# Patient Record
Sex: Male | Born: 1940 | Race: White | Marital: Married | Smoking: Former smoker
Health system: Southern US, Community
[De-identification: ages and names within clinical notes are randomized; demographics above are authoritative.]

---

## 2018-07-21 ENCOUNTER — Institutional Professional Consult (permissible substitution): Payer: Self-pay | Admitting: Pulmonary Disease

## 2018-08-10 ENCOUNTER — Institutional Professional Consult (permissible substitution): Payer: Self-pay | Admitting: Pulmonary Disease

## 2018-08-10 NOTE — Progress Notes (Deleted)
   Synopsis: Referred in *** for ***  Subjective:   PATIENT ID: Seth Sims GENDER: male DOB: 07-Nov-1941, MRN: [id number]   HPI  No chief complaint on file.   ***  No past medical history on file.   No family history on file.   Social History   Socioeconomic History  . Marital status: Not on file    Spouse name: Not on file  . Number of children: Not on file  . Years of education: Not on file  . Highest education level: Not on file  Occupational History  . Not on file  Social Needs  . Financial resource strain: Not on file  . Food insecurity:    Worry: Not on file    Inability: Not on file  . Transportation needs:    Medical: Not on file    Non-medical: Not on file  Tobacco Use  . Smoking status: Not on file  Substance and Sexual Activity  . Alcohol use: Not on file  . Drug use: Not on file  . Sexual activity: Not on file  Lifestyle  . Physical activity:    Days per week: Not on file    Minutes per session: Not on file  . Stress: Not on file  Relationships  . Social connections:    Talks on phone: Not on file    Gets together: Not on file    Attends religious service: Not on file    Active member of club or organization: Not on file    Attends meetings of clubs or organizations: Not on file    Relationship status: Not on file  . Intimate partner violence:    Fear of current or ex partner: Not on file    Emotionally abused: Not on file    Physically abused: Not on file    Forced sexual activity: Not on file  Other Topics Concern  . Not on file  Social History Narrative  . Not on file     Allergies not on file   No outpatient medications prior to visit.   No facility-administered medications prior to visit.     ROS    Objective:  Physical Exam   There were no vitals filed for this visit.  ***  CBC No results found for: WBC, RBC, HGB, HCT, PLT, MCV, MCH, MCHC, RDW, LYMPHSABS, MONOABS, EOSABS, BASOSABS   Chest  imaging:  PFT:  Labs:  Path:  Echo:  Heart Catheterization:       Assessment & Plan:   No diagnosis found.  Discussion: ***   No current outpatient medications on file.

## 2018-09-07 ENCOUNTER — Institutional Professional Consult (permissible substitution): Payer: Self-pay | Admitting: Pulmonary Disease

## 2018-09-12 ENCOUNTER — Ambulatory Visit (INDEPENDENT_AMBULATORY_CARE_PROVIDER_SITE_OTHER): Payer: No Typology Code available for payment source | Admitting: Pulmonary Disease

## 2018-09-12 ENCOUNTER — Encounter: Payer: Self-pay | Admitting: Pulmonary Disease

## 2018-09-12 VITALS — BP 118/72 | HR 76 | Ht 71.0 in | Wt 229.0 lb

## 2018-09-12 DIAGNOSIS — J849 Interstitial pulmonary disease, unspecified: Secondary | ICD-10-CM

## 2018-09-12 DIAGNOSIS — R0602 Shortness of breath: Secondary | ICD-10-CM

## 2018-09-12 NOTE — Patient Instructions (Signed)
Worsening shortness of breath: (New problem) We will check a full lung function test We will check a high-resolution CT scan of your chest We will check your oxygen while walking today in clinic  Question of interstitial lung disease seen on recent CT scan of the chest: We will arrange for a high-resolution CT scan of the chest at our center We will check a full lung function test  We will see you back in 1 to 2 weeks to go over these results

## 2018-09-12 NOTE — Progress Notes (Signed)
Synopsis: [facility]; has a history of coronary artery disease, polymyalgia rheumatica treated briefly with methotrexate.  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  Has had a history of skin mycobacterial disease.  Subjective:   PATIENT ID: Seth Sims GENDER: male DOB: 16-Jun-1941, MRN: [id number]   HPI  Chief Complaint  Patient presents with  . Pulm Consult    [facility]E. Patient states his DOE has been getting worse over the past 2 years. Has had numerous CT scans and all have been negative.     This is a pleasant 77 year old male with a past medical history significant for polymyalgia rheumatica, coronary artery disease, and atrial fibrillation who comes to my clinic today for evaluation of shortness of breath.  He says that he does not have cough or mucus production.  He has been experiencing increasing shortness of breath for the last 2 years now.  He says that he can only walk about 100 feet before having to stop and catch his breath.  He says the problem has been worsening over 2 years.  He has been evaluated by multiple different physicians for this and has been told most recently that his heart was doing okay.  That being said, he is in permanent atrial fibrillation.  He had 2 stents placed this year as well.  This year he has developed a mycobacterial infection of the skin which has been [facility].  He also has acquired immunodeficiency and has been receiving IVIG every 4 weeks.  He has been treated with clofazimine among other antibiotics for the mycobacterial infection.   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 No pertinent past medical history.   History reviewed. No pertinent family history.   Social History   Socioeconomic History  . Marital status: Married    Spouse name: Not on file  . Number of children: Not on file  . Years of education: Not on file  . Highest education level: Not on file  Occupational History  . Not on file  Social Needs  . Financial resource strain: Not on file  . Food insecurity:    Worry: Not on file    Inability: Not on file  . Transportation needs:    Medical: Not on file    Non-medical: Not on file  Tobacco Use  . Smoking status: Former Smoker    Packs/day: 1.50    Years: 60.00    Pack years: 90.00    Last attempt to quit: 01/13/1964    Years since quitting: 54.7  . Smokeless tobacco: Former Neurosurgeon    Types: Chew  Substance and Sexual Activity  . Alcohol use: Not Currently  . Drug use: Never  . Sexual activity: Not on file  Lifestyle  . Physical activity:    Days per week: Not on file    Minutes per session: Not on file  . Stress: Not on file  Relationships  .  Social connections:    Talks on phone: Not on file    Gets together: Not on file    Attends religious service: Not on file    Active member of club or organization: Not on file    Attends meetings of clubs or organizations: Not on file    Relationship status: Not on file  . Intimate partner violence:    Fear of current or ex partner: Not on file    Emotionally abused: Not on file    Physically abused: Not on file    Forced sexual activity: Not on file  Other Topics Concern  . Not on file  Social History Narrative  . Not on file     Allergies  Allergen Reactions  . Penicillins Anaphylaxis, Rash and Swelling    swelling swelling   . Hydrocodone-Acetaminophen Rash  . Omeprazole Other (See  Comments) and Rash    Unknown Unknown   . Sulfa Antibiotics Rash    Swelling Swelling Swelling   . Statins Other (See Comments)    MUSCLE WEAKNESS MUSCLE WEAKNESS   . Sulfamethoxazole-Trimethoprim      Outpatient Medications Prior to Visit  Medication Sig Dispense Refill  . aspirin 81 MG tablet Take by mouth.    . Calcium Carb-Cholecalciferol (OYSTER SHELL CALCIUM/VITAMIN D) 500-200 MG-UNIT TABS Take by mouth.    . Carboxymethylcellulose Sod PF (REFRESH PLUS) 0.5 % SOLN Place 1 drop into both eyes 4 times daily.    . Cholecalciferol (VITAMIN D3) 1000 units CAPS Take by mouth.    . clopidogrel (PLAVIX) 75 MG tablet Take by mouth.    . diltiazem (CARDIZEM CD) 180 MG 24 hr capsule Take by mouth.    . DULoxetine (CYMBALTA) 30 MG capsule Take by mouth.    . finasteride (PROSCAR) 5 MG tablet Take by mouth.    . folic acid (FOLVITE) 1 MG tablet Take by mouth.    . latanoprost (XALATAN) 0.005 % ophthalmic solution Place 1 drop into both eyes nightly.    . loratadine (CLARITIN) 10 MG tablet Take by mouth.    Marland Kitchen. LORazepam (ATIVAN) 0.5 MG tablet Take by mouth.    . magnesium oxide (MAG-OX) 400 MG tablet Take by mouth.    . mirabegron ER (MYRBETRIQ) 25 MG TB24 tablet Take by mouth.    . oxyCODONE-acetaminophen (PERCOCET) 10-325 MG tablet Take by mouth.    . oxyCODONE-acetaminophen (PERCOCET/ROXICET) 5-325 MG tablet Take by mouth.    . pantoprazole (PROTONIX) 40 MG tablet Take by mouth.    . potassium chloride SA (K-DUR,KLOR-CON) 20 MEQ tablet Take by mouth.    . pyridOXINE (B-6) 50 MG tablet Take by mouth.    . rosuvastatin (CRESTOR) 10 MG tablet Take by mouth.    . spironolactone (ALDACTONE) 25 MG tablet Take by mouth.    . tamsulosin (FLOMAX) 0.4 MG CAPS capsule Take by mouth.    . torsemide (DEMADEX) 20 MG tablet Take by mouth.    . vitamin A 4132410000 UNIT capsule Take by mouth.    . vitamin B-12 (CYANOCOBALAMIN) 1000 MCG tablet Take by mouth.    . vitamin C (ASCORBIC ACID) 500 MG  tablet Take by mouth.    . vitamin E 400 UNIT capsule Take by mouth.     No facility-administered medications prior to visit.     Review of Systems  Constitutional: Positive for malaise/fatigue. Negative for chills, fever and weight loss.  HENT: Negative for congestion, nosebleeds, sinus pain and sore throat.  Eyes: Negative for photophobia, pain and discharge.  Respiratory: Positive for shortness of breath. Negative for cough, hemoptysis, sputum production and wheezing.   Cardiovascular: Negative for chest pain, palpitations, orthopnea and leg swelling.  Gastrointestinal: Negative for abdominal pain, constipation, diarrhea, nausea and vomiting.  Genitourinary: Negative for dysuria, frequency, hematuria and urgency.  Musculoskeletal: Negative for back pain, joint pain, myalgias and neck pain.  Skin: Negative for itching and rash.  Neurological: Negative for tingling, tremors, sensory change, speech change, focal weakness, seizures, weakness and headaches.  Psychiatric/Behavioral: Negative for memory loss, substance abuse and suicidal ideas. The patient is not nervous/anxious.       Objective:  Physical Exam   Vitals:   09/12/18 1352  BP: 118/72  Pulse: 76  SpO2: 94%  Weight: 229 lb (103.9 kg)  Height: 5\' 11"  (1.803 m)   Walked 400 feet and O2 saturation went to 100% on RA  RA  Gen: well appearing, no acute distress HENT: NCAT, OP clear, neck supple without masses Eyes: PERRL, EOMi Lymph: no cervical lymphadenopathy PULM: Coarse crackles bases B, normal effort CV: Irreg irreg, no mgr, no JVD GI: BS+, soft, nontender, no hsm Derm: no rash or skin breakdown MSK: normal bulk and tone Neuro: A&Ox4, CN II-XII intact, strength 5/5 in all 4 extremities Psyche: normal mood and affect   CBC No results found for: WBC, RBC, HGB, HCT, PLT, MCV, MCH, MCHC, RDW, LYMPHSABS, MONOABS, EOSABS, BASOSABS   Chest imaging: May 2019 CT scan from the [place] commented on peripheral  reticulation in the right middle lobe and right lower lobe as well as in the left lower lobe, there is also "stable" nodules compared to a 2017 study.  A thoracic aortic aneurysm at 4.1 cm was noted.  PFT:  Labs: July 2019 labs from the [place] reviewed showing a creatinine of 1.8 and a hemoglobin of 13 mg/dL  Path:  Echo: 1610 echocardiogram from the [place] showed a normal LVEF, RVSP 33 mmHg  Heart Catheterization:  Records from the [place] reviewed where the patient was seen for dyspnea, it was felt to be multifactorial and a CPAP was recommended.  The patient declined.  Notes indicate that the patient has received the Pneumovax vaccine, Prevnar vaccine, and annual flu shots      Assessment & Plan:   Shortness of breath - Plan: CT Chest High Resolution, Pulmonary function test  ILD (interstitial lung disease) (HCC)  Discussion: Mr. Delillo is a complex medical history who comes to my clinic today for evaluation of shortness of breath.  Objectively he has crackles in the bases of his lungs and radiology recently commented on reticulation.  From what I can tell he had normal lung function testing at the [place] this year but I do not see the raw data.  He is not anemic.  He does not have weakness on physical exam.  I explained to him that the differential diagnosis of dyspnea is broad and includes lung disease, heart disease, malignancy, anemia, among other causes.  In his particular case I am concerned about interstitial lung disease given the coarse crackles on exam and reticulation seen on the CT scan of his chest.  I also wonder whether or not previous methotrexate use because have caused some degree of lung damage.  It is not clear to me if his acquired immunodeficiency has anything to do with this as he reports no symptoms suggestive of an active pulmonary infection.  Plan: Worsening shortness of breath: (New problem) We will check  a full lung function test We will check a high-resolution CT scan  of your chest We will check your oxygen while walking today in clinic  Question of interstitial lung disease seen on recent CT scan of the chest: We will arrange for a high-resolution CT scan of the chest at our center We will check a full lung function test  We will see you back in 1 to 2 weeks to go over these results    Current Outpatient Medications:  .  aspirin 81 MG tablet, Take by mouth., Disp: , Rfl:  .  Calcium Carb-Cholecalciferol (OYSTER SHELL CALCIUM/VITAMIN D) 500-200 MG-UNIT TABS, Take by mouth., Disp: , Rfl:  .  Carboxymethylcellulose Sod PF (REFRESH PLUS) 0.5 % SOLN, Place 1 drop into both eyes 4 times daily., Disp: , Rfl:  .  Cholecalciferol (VITAMIN D3) 1000 units CAPS, Take by mouth., Disp: , Rfl:  .  clopidogrel (PLAVIX) 75 MG tablet, Take by mouth., Disp: , Rfl:  .  diltiazem (CARDIZEM CD) 180 MG 24 hr capsule, Take by mouth., Disp: , Rfl:  .  DULoxetine (CYMBALTA) 30 MG capsule, Take by mouth., Disp: , Rfl:  .  finasteride (PROSCAR) 5 MG tablet, Take by mouth., Disp: , Rfl:  .  folic acid (FOLVITE) 1 MG tablet, Take by mouth., Disp: , Rfl:  .  latanoprost (XALATAN) 0.005 % ophthalmic solution, Place 1 drop into both eyes nightly., Disp: , Rfl:  .  loratadine (CLARITIN) 10 MG tablet, Take by mouth., Disp: , Rfl:  .  LORazepam (ATIVAN) 0.5 MG tablet, Take by mouth., Disp: , Rfl:  .  magnesium oxide (MAG-OX) 400 MG tablet, Take by mouth., Disp: , Rfl:  .  mirabegron ER (MYRBETRIQ) 25 MG TB24 tablet, Take by mouth., Disp: , Rfl:  .  oxyCODONE-acetaminophen (PERCOCET) 10-325 MG tablet, Take by mouth., Disp: , Rfl:  .  oxyCODONE-acetaminophen (PERCOCET/ROXICET) 5-325 MG tablet, Take by mouth., Disp: , Rfl:  .  pantoprazole (PROTONIX) 40 MG tablet, Take by mouth., Disp: , Rfl:  .  potassium chloride SA (K-DUR,KLOR-CON) 20 MEQ tablet, Take by mouth., Disp: , Rfl:  .  pyridOXINE (B-6) 50 MG tablet, Take by mouth., Disp: , Rfl:  .  rosuvastatin (CRESTOR) 10 MG tablet, Take  by mouth., Disp: , Rfl:  .  spironolactone (ALDACTONE) 25 MG tablet, Take by mouth., Disp: , Rfl:  .  tamsulosin (FLOMAX) 0.4 MG CAPS capsule, Take by mouth., Disp: , Rfl:  .  torsemide (DEMADEX) 20 MG tablet, Take by mouth., Disp: , Rfl:  .  vitamin A 40981 UNIT capsule, Take by mouth., Disp: , Rfl:  .  vitamin B-12 (CYANOCOBALAMIN) 1000 MCG tablet, Take by mouth., Disp: , Rfl:  .  vitamin C (ASCORBIC ACID) 500 MG tablet, Take by mouth., Disp: , Rfl:  .  vitamin E 400 UNIT capsule, Take by mouth., Disp: , Rfl:

## 2018-09-14 ENCOUNTER — Ambulatory Visit (HOSPITAL_BASED_OUTPATIENT_CLINIC_OR_DEPARTMENT_OTHER)
Admission: RE | Admit: 2018-09-14 | Discharge: 2018-09-14 | Disposition: A | Discharge status: Home / Self Care | Payer: No Typology Code available for payment source | Source: Ambulatory Visit | Attending: Pulmonary Disease | Admitting: Pulmonary Disease

## 2018-09-14 DIAGNOSIS — I2510 Atherosclerotic heart disease of native coronary artery without angina pectoris: Secondary | ICD-10-CM | POA: Diagnosis not present

## 2018-09-14 DIAGNOSIS — I700 Atherosclerosis of aorta: Secondary | ICD-10-CM | POA: Diagnosis not present

## 2018-09-14 DIAGNOSIS — J398 Other specified diseases of upper respiratory tract: Secondary | ICD-10-CM | POA: Diagnosis not present

## 2018-09-14 DIAGNOSIS — J8410 Pulmonary fibrosis, unspecified: Secondary | ICD-10-CM | POA: Insufficient documentation

## 2018-09-14 DIAGNOSIS — R0602 Shortness of breath: Secondary | ICD-10-CM

## 2018-09-18 ENCOUNTER — Telehealth: Payer: Self-pay | Admitting: Pulmonary Disease

## 2018-09-18 NOTE — Telephone Encounter (Signed)
Spoke with pt's wife, not on DPR, regarding f/u appt for pt Pt is suppose to have full PFT with f/u ov with BQ this week or next. Needs to be scheduled, pt will call back tomorrow  Did not go over CT results with wife as she is not on DPR at this time.

## 2018-09-20 NOTE — Telephone Encounter (Signed)
LMTCB. We need to reschedule appt for OV and PFT and go over CT results.

## 2018-09-21 ENCOUNTER — Other Ambulatory Visit (INDEPENDENT_AMBULATORY_CARE_PROVIDER_SITE_OTHER): Payer: No Typology Code available for payment source

## 2018-09-21 ENCOUNTER — Encounter: Payer: Self-pay | Admitting: Pulmonary Disease

## 2018-09-21 ENCOUNTER — Ambulatory Visit: Payer: No Typology Code available for payment source

## 2018-09-21 ENCOUNTER — Ambulatory Visit (INDEPENDENT_AMBULATORY_CARE_PROVIDER_SITE_OTHER): Payer: No Typology Code available for payment source | Admitting: Pulmonary Disease

## 2018-09-21 VITALS — BP 112/60 | HR 64 | Ht 70.0 in | Wt 232.0 lb

## 2018-09-21 DIAGNOSIS — R0602 Shortness of breath: Secondary | ICD-10-CM

## 2018-09-21 DIAGNOSIS — R918 Other nonspecific abnormal finding of lung field: Secondary | ICD-10-CM

## 2018-09-21 DIAGNOSIS — J849 Interstitial pulmonary disease, unspecified: Secondary | ICD-10-CM

## 2018-09-21 DIAGNOSIS — R0600 Dyspnea, unspecified: Secondary | ICD-10-CM | POA: Insufficient documentation

## 2018-09-21 LAB — PULMONARY FUNCTION TEST
DL/VA % PRED: 74 %
DL/VA: 3.4 ml/min/mmHg/L
DLCO UNC % PRED: 60 %
DLCO unc: 19.43 ml/min/mmHg
FEF 25-75 POST: 2.45 L/s
FEF 25-75 PRE: 2.25 L/s
FEF2575-%CHANGE-POST: 8 %
FEF2575-%PRED-POST: 123 %
FEF2575-%Pred-Pre: 113 %
FEV1-%CHANGE-POST: 1 %
FEV1-%Pred-Post: 95 %
FEV1-%Pred-Pre: 93 %
FEV1-POST: 2.75 L
FEV1-Pre: 2.7 L
FEV1FVC-%CHANGE-POST: 2 %
FEV1FVC-%Pred-Pre: 106 %
FEV6-%Change-Post: 0 %
FEV6-%Pred-Post: 92 %
FEV6-%Pred-Pre: 91 %
FEV6-PRE: 3.46 L
FEV6-Post: 3.49 L
FEV6FVC-%Change-Post: 1 %
FEV6FVC-%PRED-PRE: 105 %
FEV6FVC-%Pred-Post: 107 %
FVC-%CHANGE-POST: 0 %
FVC-%PRED-POST: 86 %
FVC-%Pred-Pre: 86 %
FVC-Post: 3.49 L
FVC-Pre: 3.51 L
POST FEV1/FVC RATIO: 79 %
PRE FEV6/FVC RATIO: 99 %
Post FEV6/FVC ratio: 100 %
Pre FEV1/FVC ratio: 77 %
RV % PRED: 100 %
RV: 2.66 L
TLC % pred: 92 %
TLC: 6.54 L

## 2018-09-21 LAB — CBC WITH DIFFERENTIAL/PLATELET
BASOS ABS: 0.1 10*3/uL (ref 0.0–0.1)
Basophils Relative: 0.9 % (ref 0.0–3.0)
Eosinophils Absolute: 0.1 10*3/uL (ref 0.0–0.7)
Eosinophils Relative: 1.5 % (ref 0.0–5.0)
HCT: 38.9 % — ABNORMAL LOW (ref 39.0–52.0)
Hemoglobin: 12.9 g/dL — ABNORMAL LOW (ref 13.0–17.0)
LYMPHS PCT: 21.7 % (ref 12.0–46.0)
Lymphs Abs: 1.7 10*3/uL (ref 0.7–4.0)
MCHC: 33.3 g/dL (ref 30.0–36.0)
MCV: 99.2 fl (ref 78.0–100.0)
MONOS PCT: 9.6 % (ref 3.0–12.0)
Monocytes Absolute: 0.8 10*3/uL (ref 0.1–1.0)
Neutro Abs: 5.3 10*3/uL (ref 1.4–7.7)
Neutrophils Relative %: 66.3 % (ref 43.0–77.0)
Platelets: 205 10*3/uL (ref 150.0–400.0)
RBC: 3.92 Mil/uL — AB (ref 4.22–5.81)
RDW: 15.3 % (ref 11.5–15.5)
WBC: 7.9 10*3/uL (ref 4.0–10.5)

## 2018-09-21 LAB — BASIC METABOLIC PANEL
BUN: 18 mg/dL (ref 6–23)
CO2: 26 mEq/L (ref 19–32)
Calcium: 9 mg/dL (ref 8.4–10.5)
Chloride: 105 mEq/L (ref 96–112)
Creatinine, Ser: 1.51 mg/dL — ABNORMAL HIGH (ref 0.40–1.50)
GFR: 47.53 mL/min — AB (ref >60.00)
Glucose, Bld: 111 mg/dL — ABNORMAL HIGH (ref 70–99)
POTASSIUM: 3.9 meq/L (ref 3.5–5.1)
SODIUM: 139 meq/L (ref 135–145)

## 2018-09-21 LAB — TSH: TSH: 3.34 u[IU]/mL (ref 0.35–4.50)

## 2018-09-21 NOTE — Assessment & Plan Note (Signed)
Follow-up with Dr. Kendrick Fries in 4 to 6 weeks in [facility] office  Repeat high-res CT in 1 year  Labwork today  >>>ILD Panel

## 2018-09-21 NOTE — Progress Notes (Signed)
@Patient  ID: Tresa Endo, male    DOB: Oct 09, 1941, 77 y.o.   MRN: [id number]  Chief Complaint  Patient presents with  . Follow-up    PFT results    Referring provider: Sondra Come, MD  HPI:  77 year old male former smoker followed in our office for evaluation of dyspnea and interstitial lung disease.  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  0-pack-year smoking history.   Maintenance: None Pt of: [facility]  Recent [facility] Encounters:   09/12/2018-initial office visit-[place] Patient referred by the [facility] for dyspnea.  Patient reports that dyspnea has been getting worse over the past 2 years patient has had numerous CT scans that he reports of all been negative.  He says he can only way up to walk about 100 feet before having to stop for shortness of breath.  Patient reports he has permanent atrial fibrillation.  Is had 2 stents placed in the past.  Patient reports he developed a mycobacterial infection of the skin which has been treated by [facility].  Patient quit smoking in the 1960s.  Less than 10-pack-year total.  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  Patient was diagnosed with polymyalgia rheumatica over 5 years ago.  Patient was treated with prednisone and methotrexate.  Patient was treated with methylprednisone for 7 years and this "destroyed his immune system".  Patient is now on IVIG every 28 days.  Patient had a mycobacterial infection of his skin has been on clofazimine and IVIG. Plan: PFT, high-res CT, walking office to check oxygenation, follow-up in 1 to 2 weeks  09/21/2018  - Visit   77 year old male former smoker presenting  today for follow-up visit.  Patient completed a pulmonary function test today.  Patient reporting continued chronic shortness of breath.  Patient is also requesting the results of his high-res CT that he is completed since last office visit.  Pulmonary function test today showing FVC 3.51 (86% predicted), ratio 77, FEV1 93, no significant bronchodilator response, DLCO 60.  Patient reports that he had pulmonary function test before and the [facility].  Patient reports that he will try to get copies so we can add those to her records today.  Patient presented today with CT Angio records from March/2019.  Patient would like this copy that we can have been added to her chart.  Patient is also inquiring about chronic prednisone use.  Patient would like to start low-dose prednisone therapy in order to better manage chronic pain and polymyalgia rheumatica.  Patient reports that IVIG infusions are currently on hold.  This is currently being managed by [facility].  Regarding patient's chronic skin infections.   Tests:   Chest imaging: 03/10/2018-CT Angio/[facility]-no acute pulmonary embolus, 4.1 cm ascending thoracic aortic aneurysm without dissection, tiny tree-in-bud densities along the periphery of both lower lobes, lingula, right middle lobe compatible with bronchiolitis, 4 mm right upper lobe pulmonary nodule  May 2019 CT scan from the [place] commented on peripheral reticulation in the right middle lobe and right lower lobe as well as in the left lower lobe, there is also "stable" nodules compared to a 2017 study.  A thoracic aortic aneurysm at 4.1 cm was noted.  09/14/2018-CT chest high-res-subtle changes of mild fibrosis in the lung bases bilaterally which appears relatively  stable from prior chest CT on 03/10/2018.  Findings are nonspecific and classified as indeterminate for UIP, primary differential considerations are usually early usual interstitial pneumonia versus nonspecific interstitial pneumonia given the presence of  air trapping NSIP is more favored.   >>>Recommend repeating high-res CT in 12 months to assess >>>Severe tracheobronchomalacia  PFT: 09/21/18-pulmonary function test- FVC 3.51 (86% predicted), ratio 77, FEV1 93%, DLCO 60, no significant bronchodilator response  Labs: July 2019 labs from the [place] reviewed showing a creatinine of 1.8 and a hemoglobin of 13 mg/dL  Path:  Echo: 1610 echocardiogram from the [place] showed a normal LVEF, RVSP 33 mmHg  Heart Catheterization:  Records from the [place] reviewed where the patient was seen for dyspnea, it was felt to be multifactorial and a CPAP was recommended.  The patient declined.    Chart Review:     Specialty Problems      Pulmonary Problems   Dyspnea   ILD (interstitial lung disease) (HCC)    09/14/2018-CT chest high-res-subtle changes of mild fibrosis in the lung bases bilaterally which appears relatively stable from prior chest CT on 03/10/2018.  Findings are nonspecific and classified as indeterminate for UIP, primary differential considerations are usually early usual interstitial pneumonia versus nonspecific interstitial pneumonia given the presence of air trapping NSIP is more favored.   >>>Recommend repeating high-res CT in 12 months to assess >>>Severe tracheobronchomalacia  PFT: 09/21/18-pulmonary function test- FVC 3.51 (86% predicted), ratio 77, FEV1 93%, DLCO 60, no significant bronchodilator response         Allergies  Allergen Reactions  . Hydrocodone-Acetaminophen Rash  . Omeprazole Other (See Comments) and Rash    Unknown Unknown   . Sulfa Antibiotics Rash    Swelling Swelling Swelling   . Statins Other (See Comments)    MUSCLE WEAKNESS MUSCLE WEAKNESS   . Sulfamethoxazole-Trimethoprim      There is no immunization history on file for this patient.  >>> Patient plans on obtaining flu vaccine from the [place] >>>Notes indicate that the patient has received the Pneumovax vaccine, Prevnar vaccine, and annual flu  shots   History reviewed. No pertinent past medical history.  Tobacco History: Social History   Tobacco Use  Smoking Status Former Smoker  . Packs/day: 1.50  . Years: 60.00  . Pack years: 90.00  . Last attempt to quit: 01/13/1964  . Years since quitting: 54.7  Smokeless Tobacco Former Neurosurgeon  . Types: Chew   Counseling given: Yes  Continue to not smoke  Outpatient Encounter Medications as of 09/21/2018  Medication Sig  . aspirin 81 MG tablet Take by mouth.  . Calcium Carb-Cholecalciferol (OYSTER SHELL CALCIUM/VITAMIN D) 500-200 MG-UNIT TABS Take by mouth.  . Carboxymethylcellulose Sod PF (REFRESH PLUS) 0.5 % SOLN Place 1 drop into both eyes 4 times daily.  . Cholecalciferol (VITAMIN D3) 1000 units CAPS Take by mouth.  . clopidogrel (PLAVIX) 75 MG tablet Take by mouth.  . diltiazem (CARDIZEM CD) 180 MG 24 hr capsule Take by mouth.  . DULoxetine (CYMBALTA) 30 MG capsule Take by mouth.  . finasteride (PROSCAR) 5 MG tablet Take by mouth.  . latanoprost (XALATAN) 0.005 % ophthalmic solution Place 1 drop into both eyes nightly.  . loratadine (CLARITIN) 10 MG tablet Take by mouth.  . magnesium oxide (MAG-OX) 400 MG tablet Take by mouth.  . mirabegron ER (MYRBETRIQ) 25 MG TB24 tablet Take by mouth.  . oxyCODONE-acetaminophen (PERCOCET) 10-325 MG tablet Take by mouth.  . pantoprazole (PROTONIX) 40 MG tablet Take  by mouth.  . pyridOXINE (B-6) 50 MG tablet Take by mouth.  . rosuvastatin (CRESTOR) 10 MG tablet Take by mouth.  . tamsulosin (FLOMAX) 0.4 MG CAPS capsule Take by mouth.  . vitamin A 46962 UNIT capsule Take by mouth.  . vitamin B-12 (CYANOCOBALAMIN) 1000 MCG tablet Take by mouth.  . vitamin C (ASCORBIC ACID) 500 MG tablet Take by mouth.  . vitamin E 400 UNIT capsule Take by mouth.  . folic acid (FOLVITE) 1 MG tablet Take by mouth.  Marland Kitchen LORazepam (ATIVAN) 0.5 MG tablet Take 0.5 mg by mouth as needed.   Marland Kitchen oxyCODONE-acetaminophen (PERCOCET/ROXICET) 5-325 MG tablet Take by mouth.    . potassium chloride SA (K-DUR,KLOR-CON) 20 MEQ tablet Take by mouth as needed (only takes when taking the fluid pill or sweating a lot).   Marland Kitchen spironolactone (ALDACTONE) 25 MG tablet Take by mouth as needed (takes when swelling).   . torsemide (DEMADEX) 20 MG tablet Take by mouth as needed (takes when swelling occurs).    No facility-administered encounter medications on file as of 09/21/2018.      Review of Systems  Review of Systems  Constitutional: Positive for activity change and fatigue. Negative for chills, fever and unexpected weight change.  HENT: Negative for postnasal drip, rhinorrhea, sinus pressure, sinus pain, sneezing and sore throat.   Eyes: Negative.   Respiratory: Positive for shortness of breath. Negative for cough and wheezing.   Cardiovascular: Negative for chest pain, palpitations and leg swelling.  Gastrointestinal: Negative for constipation, diarrhea, nausea and vomiting.  Endocrine: Negative.   Musculoskeletal: Positive for arthralgias and myalgias.  Skin: Positive for rash.  Neurological: Negative for dizziness and headaches.  Psychiatric/Behavioral: Negative.  Negative for dysphoric mood. The patient is not nervous/anxious.   All other systems reviewed and are negative.    Physical Exam  BP 112/60 (BP Location: Left Arm, Cuff Size: Normal)   Pulse 64   Ht 5\' 10"  (1.778 m)   Wt 232 lb (105.2 kg)   SpO2 95%   BMI 33.29 kg/m   Wt Readings from Last 5 Encounters:  09/21/18 232 lb (105.2 kg)  09/12/18 229 lb (103.9 kg)   On RA  Physical Exam  Constitutional: He is oriented to person, place, and time and well-developed, well-nourished, and in no distress. No distress.  HENT:  Head: Normocephalic and atraumatic.  Right Ear: Hearing, tympanic membrane, external ear and ear canal normal.  Left Ear: Hearing, tympanic membrane, external ear and ear canal normal.  Nose: Nose normal. Right sinus exhibits no maxillary sinus tenderness and no frontal sinus  tenderness. Left sinus exhibits no maxillary sinus tenderness and no frontal sinus tenderness.  Mouth/Throat: Uvula is midline and oropharynx is clear and moist. No oropharyngeal exudate.  Eyes: Pupils are equal, round, and reactive to light.  Neck: Normal range of motion. Neck supple. No JVD present.  Cardiovascular: Normal heart sounds. A regularly irregular rhythm present. Tachycardia present.  Pulmonary/Chest: Effort normal. No accessory muscle usage. No respiratory distress. He has no decreased breath sounds. He has no wheezes. He has no rhonchi. He has rales (Bibasilar coarse crackles).  Abdominal: Soft. Bowel sounds are normal. There is no tenderness.  Musculoskeletal: Normal range of motion. He exhibits no edema.  Lymphadenopathy:    He has no cervical adenopathy.  Neurological: He is alert and oriented to person, place, and time. Gait normal.  Skin: Skin is warm, dry and intact. Rash noted. He is not diaphoretic. No erythema.  Chronic skin infections  [facility].  Psychiatric: Mood, memory, affect and judgment normal.  Nursing note and vitals reviewed.   Patient ambulated in office today.  Patient was only able to complete one lap for heart rate was in the 130s to 150s.  Patient oxygen saturation did not desat.  Lab Results:  CBC    Component Value Date/Time   WBC 7.9 09/21/2018 1510   RBC 3.92 (L) 09/21/2018 1510   HGB 12.9 (L) 09/21/2018 1510   HCT 38.9 (L) 09/21/2018 1510   PLT 205.0 09/21/2018 1510   MCV 99.2 09/21/2018 1510   MCHC 33.3 09/21/2018 1510   RDW 15.3 09/21/2018 1510   LYMPHSABS 1.7 09/21/2018 1510   MONOABS 0.8 09/21/2018 1510   EOSABS 0.1 09/21/2018 1510   BASOSABS 0.1 09/21/2018 1510    BMET    Component Value Date/Time   NA 139 09/21/2018 1510   K 3.9 09/21/2018 1510   CL 105 09/21/2018 1510   CO2 26 09/21/2018 1510   GLUCOSE 111 (H) 09/21/2018 1510   BUN 18 09/21/2018 1510   CREATININE 1.51 (H) 09/21/2018 1510   CALCIUM 9.0  09/21/2018 1510    BNP No results found for: BNP  ProBNP No results found for: PROBNP  Imaging: Ct Chest High Resolution  Result Date: 09/15/2018 CLINICAL DATA:  77 year old male with shortness of breath for the past 2 years. Former smoker. EXAM: CT CHEST WITHOUT CONTRAST TECHNIQUE: Multidetector CT imaging of the chest was performed following the standard protocol without intravenous contrast. High resolution imaging of the lungs, as well as inspiratory and expiratory imaging, was performed. COMPARISON:  Chest CT 03/10/2018. FINDINGS: Cardiovascular: Heart size is normal. Trace amount of pericardial fluid and/or thickening, unlikely to be of hemodynamic significance at this time. There is aortic atherosclerosis, as well as atherosclerosis of the great vessels of the mediastinum and the coronary arteries, including calcified atherosclerotic plaque in the left main, left anterior descending, left circumflex and right coronary arteries. Calcifications of the aortic valve. Mediastinum/Nodes: No pathologically enlarged mediastinal or hilar lymph nodes. Please note that accurate exclusion of hilar adenopathy is limited on noncontrast CT scans. Esophagus is unremarkable in appearance. No axillary lymphadenopathy. Lungs/Pleura: 4 mm right upper lobe nodule (axial image 40 of series 5). Small calcified granuloma in the posterior aspect of the right upper lobe also noted. No larger more suspicious appearing pulmonary nodules or masses are noted. No acute consolidative airspace disease. No pleural effusions. High-resolution images demonstrate scattered areas of mild peripheral predominant ground-glass attenuation and septal thickening most evident throughout the lung bases where there is also some associated peripheral bronchiolectasis. No traction bronchiectasis or honeycombing noted at this time. Inspiratory and expiratory imaging demonstrates moderate air trapping indicative of small airways disease. There is  also profound collapse of the trachea and mainstem bronchi during expiration, indicative of severe tracheobronchomalacia. Upper Abdomen: Aortic atherosclerosis. Musculoskeletal: There are no aggressive appearing lytic or blastic lesions noted in the visualized portions of the skeleton. IMPRESSION: 1. Subtle changes of mild fibrosis in the lung bases bilaterally, which appear relatively stable compared to the prior chest CT from 03/10/2018. At this time, findings are nonspecific and classified as ATS indeterminate for usual interstitial pneumonia (UIP). Primary differential considerations are early usual interstitial pneumonia versus nonspecific interstitial pneumonia, and given the presence of air trapping, nonspecific interstitial pneumonia (NSIP) is more favored at this time. Repeat high-resolution chest CT is recommended in 12 months to assess for temporal changes in the appearance of the lung parenchyma.  2. Severe tracheobronchomalacia. 3. Aortic atherosclerosis, in addition to left main and 3 vessel coronary artery disease. Assessment for potential risk factor modification, dietary therapy or pharmacologic therapy may be warranted, if clinically indicated. 4. There are calcifications of the aortic valve. Echocardiographic correlation for evaluation of potential valvular dysfunction may be warranted if clinically indicated. Aortic Atherosclerosis (ICD10-I70.0). Electronically Signed   By: Trudie Reed M.D.   On: 09/15/2018 09:29      Assessment & Plan:   77 year old male patient presenting today for follow-up visit.  We reviewed CT results as well as pulmonary function test results.  We will continue to monitor patient at this time.  Will obtain ILD panel lab work (ANA, ANCA, anti-Jo, hypersensitivity pneumonitis, Sjogren's), will also obtain baseline lab work to further evaluate for dyspnea (TSH, bmet, proBNP, CBC).  I discussed the case with Dr. Kendrick Fries as well as with the patient regarding starting  chronic prednisone use.  We came to the conclusion that we do not see if this would be a long-term clinical benefit for the patient actually could increase patient's chance of having worsening clinical outcomes.  Patient agrees to hold off on chronic prednisone right now.  Patient plans on obtaining flu vaccine from the [place].  Patient is a Probation officer.  [facility] visits with [facility] until January/04/2019. Further visits past this date will require request and approval by the [place].  ILD (interstitial lung disease) (HCC) Follow-up with Dr. Kendrick Fries in 4 to 6 weeks in [facility] office  Walk today in office on room air  Repeat high-res CT in 1 year  [facility] >>> We will attempt to receive records from previous PFTs from [place]  Labwork today  >>>ILD Panel     Dyspnea Lab work today >>>ILD panel >>>CBC, TSH, proBNP, BMET  Abnormal findings on diagnostic imaging of lung Follow-up with Dr. Kendrick Fries in 4 to 6 weeks in [facility] office  Repeat high-res CT in 1 year  Labwork today  >>>ILD Panel     This appointment was 44 minutes along with her 50% of that time in direct face-to-face patient care, explanation of PFT results, review of high-res CT, assessment, plan of care follow-up.  I discussed the case via telephone with Dr. Kendrick Fries updated him on PFT results as well as updating him with plan of care.   Coral Ceo, NP 09/21/2018

## 2018-09-21 NOTE — Assessment & Plan Note (Signed)
Lab work today >>>ILD panel >>>CBC, TSH, proBNP, BMET

## 2018-09-21 NOTE — Patient Instructions (Addendum)
Follow-up with Dr. Kendrick Fries in 4 to 6 weeks in [facility] office  Walk today in office on room air  Repeat high-res CT in 1 year  [facility]  Labwork today     It is flu season:   >>>Remember to be washing your hands regularly, using hand sanitizer, be careful to use around herself with has contact with people who are sick will increase her chances of getting sick yourself. >>> Best ways to protect herself from the flu: Receive the yearly flu vaccine, practice good hand hygiene washing with soap and also using hand sanitizer when available, eat a nutritious meals, get adequate rest, hydrate appropriately   Please contact the office if your symptoms worsen or you have concerns that you are not improving.   Thank you for choosing [facility] for your healthcare, and for allowing [place] to partner with you on your healthcare journey. I am thankful to be able to provide care to you today.   Elisha Headland FNP-C

## 2018-09-21 NOTE — Telephone Encounter (Signed)
Called and spoke with pt to verify the appt information. Also stated to pt the results of the HRCT and that Arlys John would go over results in more detail with him at [facility].  Pt expressed understanding. Nothing further needed.

## 2018-09-21 NOTE — Telephone Encounter (Signed)
Pt is calling back [phone]

## 2018-09-21 NOTE — Assessment & Plan Note (Addendum)
Follow-up with Dr. Kendrick Fries in 4 to 6 weeks in [facility] office  Walk today in office on room air  Repeat high-res CT in 1 year  [facility] >>> We will attempt to receive records from previous PFTs from [facility] today  >>>ILD Panel

## 2018-09-21 NOTE — Telephone Encounter (Signed)
Pt has been scheduled to have Full PFT followed by [facility] with Elisha Headland, NP today, 10/3.  Nothing further needed.

## 2018-09-25 LAB — ANTI-JO 1 ANTIBODY, IGG: Anti JO-1: <0.2 AI (ref 0.0–0.9)

## 2018-09-25 LAB — ANA,IFA RA DIAG PNL W/RFLX TIT/PATN
Anti Nuclear Antibody(ANA): NEGATIVE
Cyclic Citrullin Peptide Ab: <16 UNITS
Rhuematoid fact SerPl-aCnc: <14 IU/mL (ref <14)

## 2018-09-25 LAB — HYPERSENSITIVITY PNEUMONITIS
A. PULLULANS ABS: NEGATIVE
A.Fumigatus #1 Abs: NEGATIVE
Micropolyspora faeni, IgG: NEGATIVE
PIGEON SERUM ABS: NEGATIVE
THERMOACT. SACCHARII: NEGATIVE
THERMOACTINOMYCES VULGARIS IGG: NEGATIVE

## 2018-09-25 LAB — SJOGREN'S SYNDROME ANTIBODS(SSA + SSB)
SSA (Ro) (ENA) Antibody, IgG: <1 AI
SSB (LA) (ENA) ANTIBODY, IGG: NEGATIVE AI

## 2018-09-25 LAB — ANCA SCREEN W REFLEX TITER: ANCA SCREEN: NEGATIVE

## 2018-09-25 LAB — PRO B NATRIURETIC PEPTIDE: NT-PRO BNP: 333 pg/mL (ref 0–486)

## 2018-09-25 NOTE — Progress Notes (Signed)
Please let patient know that blood work for interstitial lung disease has come back negative. No changes in plan of care.   Will route results to Dr. Ulyses Jarred that he is aware as well.  Elisha Headland FNP

## 2018-09-29 NOTE — Progress Notes (Signed)
Reviewed, agree 

## 2018-10-24 ENCOUNTER — Ambulatory Visit (INDEPENDENT_AMBULATORY_CARE_PROVIDER_SITE_OTHER): Payer: No Typology Code available for payment source | Admitting: Pulmonary Disease

## 2018-10-24 ENCOUNTER — Encounter: Payer: Self-pay | Admitting: Pulmonary Disease

## 2018-10-24 VITALS — BP 132/90 | HR 115 | Ht 70.0 in | Wt 228.0 lb

## 2018-10-24 DIAGNOSIS — R0602 Shortness of breath: Secondary | ICD-10-CM | POA: Diagnosis not present

## 2018-10-24 DIAGNOSIS — R918 Other nonspecific abnormal finding of lung field: Secondary | ICD-10-CM | POA: Diagnosis not present

## 2018-10-24 DIAGNOSIS — J398 Other specified diseases of upper respiratory tract: Secondary | ICD-10-CM | POA: Diagnosis not present

## 2018-10-24 DIAGNOSIS — G4733 Obstructive sleep apnea (adult) (pediatric): Secondary | ICD-10-CM

## 2018-10-24 DIAGNOSIS — J849 Interstitial pulmonary disease, unspecified: Secondary | ICD-10-CM

## 2018-10-24 NOTE — Progress Notes (Signed)
Synopsis: ILD, Tracheobronchomalacia, OSA on CPAP; [facility]; has a history of coronary artery disease, polymyalgia rheumatica treated briefly with methotrexate.  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  Has had a history of skin mycobacterial disease.  A CT scan of his chest in October 2019 showed a very mild interstitial infiltrate predominantly in the right base but some in the left base, however there was severe tracheobronchomalacia.  Subjective:   PATIENT ID: Seth Sims GENDER: male DOB: 11/03/1941, MRN: [id number]   HPI  Chief Complaint  Patient presents with  . Follow-up    4-6 wk f/u for ILD. Per patient his breathing has been ok since his last visit.     Overall Seth Sims says that he is feeling better since the last visit though he still struggles with shortness of breath when he bends over while doing activity out in the yard.  He says that he has not had bronchitis or pneumonia since last visit.  He does not have much trouble with cough.  He does suffer from anxiety and sometimes he will actually become tremulous because of the severity of his anxiety.  He does not exercise routinely because he says it makes him short of breath.  He says that he has not been using his CPAP machine lately because of a severe leak from the mask.  History reviewed. No pertinent past medical history.   History reviewed. No pertinent family history.    Review of Systems  Constitutional: Positive for malaise/fatigue. Negative for chills, fever and weight loss.  HENT: Negative for congestion, nosebleeds, sinus pain and sore throat.   Eyes: Negative for photophobia, pain and discharge.  Respiratory: Positive for shortness of breath. Negative for cough, hemoptysis, sputum production and wheezing.   Cardiovascular: Negative for chest pain, palpitations, orthopnea and leg swelling.  Gastrointestinal: Negative for abdominal pain, constipation, diarrhea,  nausea and vomiting.  Genitourinary: Negative for dysuria, frequency, hematuria and urgency.  Musculoskeletal: Negative for back pain, joint pain, myalgias and neck pain.  Skin: Negative for itching and rash.  Neurological: Negative for tingling, tremors, sensory change, speech change, focal weakness, seizures, weakness and headaches.  Psychiatric/Behavioral: Negative for memory loss, substance abuse and suicidal ideas. The patient is not nervous/anxious.       Objective:  Physical Exam   Vitals:   10/24/18 1346  BP: 132/90  Pulse: (!) 115  SpO2: 96%  Weight: 228 lb (103.4 kg)  Height: 5\' 10"  (1.778 m)     RA  Gen: well appearing HENT: OP clear, TM's clear, neck supple PULM: Scant crackles bases, normal percussion CV: RRR, no mgr, trace edema GI: BS+, soft, nontender Derm: no cyanosis or rash Psyche: normal mood and affect   CBC    Component Value Date/Time   WBC 7.9 09/21/2018 1510   RBC 3.92 (L) 09/21/2018 1510   HGB 12.9 (L) 09/21/2018 1510   HCT 38.9 (L) 09/21/2018 1510   PLT 205.0 09/21/2018 1510   MCV 99.2 09/21/2018 1510   MCHC 33.3 09/21/2018 1510   RDW 15.3 09/21/2018 1510   LYMPHSABS 1.7 09/21/2018 1510   MONOABS 0.8 09/21/2018 1510   EOSABS 0.1 09/21/2018 1510   BASOSABS 0.1 09/21/2018 1510     Chest imaging: May 2019 CT scan from the [place] commented on peripheral reticulation in the right middle lobe and right lower lobe as well as in the left lower lobe, there is also "stable" nodules compared to a 2017 study.  A thoracic aortic aneurysm at 4.1 cm was noted. September 21, 2018 CT chest images independently reviewed showing slight reticulation, intralobular septal thickening question mild bronchiolectasis in the bases of both lungs right greater than left, overall pulmonary parenchyma is normal though throughout the majority of the lungs, there are some pulmonary nodules seen, severe tracheobronchomalacia, patulous esophagus  PFT: 09/21/2018 PFT FVC 3.51  (86% predicted), ratio 77, DLCO 19.26mL 60% pred  Labs: July 2019 labs from the [place] reviewed showing a creatinine of 1.8 and a hemoglobin of 13 mg/dL 16/1096 Jo-1 negative, CCP negative, RF negative, SSA/SSB neg, ANA negative  Path:  Echo: 2019 echocardiogram from the [place] showed a normal LVEF, RVSP 33 mmHg  Heart Catheterization:       Assessment & Plan:   ILD (interstitial lung disease) (HCC) - Plan: AMB referral to pulmonary rehabilitation  Abnormal findings on diagnostic imaging of lung  Shortness of breath  Tracheobronchomalacia  OSA (obstructive sleep apnea)  Discussion: I have personally reviewed the images from carries CT chest from last month which shows a very mild interstitial infiltrate but most predominantly severe tracheobronchial malacia.  The tracheobronchomalacia is the reason for his shortness of breath and not the very minor interstitial lung disease.  I explained to him today that he needs to continue using a pneumatic stent (CPAP) to treat this nightly and there is not a physical stent which could be placed in his airway to solve this problem.  I also advised that he is at increased risk for recurrent episodes of bronchitis and so if he has an episode of cough with mucus production he needs to call Seth Sims and let Seth Sims know right away so that we can treat him with antibiotics.  Again the interstitial lung disease is likely related to his underlying connective tissue disease open (as is the tracheobronchomalacia for that matter) but there is not a medical therapy that I am aware of which would slow this process.  Plan: Tracheobronchomalacia: This is the medical term that means that your airways collapse when you breathe Is important for you to use CPAP every night It is important for you to let Seth Sims know if you get a cough or cold with mucus production because you should be treated with antibiotics sooner rather than later  Interstitial lung disease: This is the  medical term for the very mild scarring that was seen in the base of your lungs.  Again, this is mild, it is likely related to your underlying polymyalgia rheumatica, but it is not severe enough to be causing your shortness of breath We will monitor this with a repeat lung function test in April 2020 Ask your primary care physician if you can have a flu shot  Shortness of breath/deconditioning: I strongly recommend that she participate in a regular, structured exercise routine. I will refer you back to pulmonary rehab.  We will see you back in April 2020 or sooner if needed  > 50% of this 25 minute visit spent face to face   Current Outpatient Medications:  .  aspirin 81 MG tablet, Take by mouth., Disp: , Rfl:  .  Calcium Carb-Cholecalciferol (OYSTER SHELL CALCIUM/VITAMIN D) 500-200 MG-UNIT TABS, Take by mouth., Disp: , Rfl:  .  Carboxymethylcellulose Sod PF (REFRESH PLUS) 0.5 % SOLN, Place 1 drop into both eyes 4 times daily., Disp: , Rfl:  .  Cholecalciferol (VITAMIN D3) 1000 units CAPS, Take by mouth., Disp: , Rfl:  .  clopidogrel (PLAVIX) 75 MG tablet, Take  by mouth., Disp: , Rfl:  .  diltiazem (CARDIZEM CD) 180 MG 24 hr capsule, Take by mouth., Disp: , Rfl:  .  DULoxetine (CYMBALTA) 30 MG capsule, Take by mouth., Disp: , Rfl:  .  finasteride (PROSCAR) 5 MG tablet, Take by mouth., Disp: , Rfl:  .  folic acid (FOLVITE) 1 MG tablet, Take by mouth., Disp: , Rfl:  .  latanoprost (XALATAN) 0.005 % ophthalmic solution, Place 1 drop into both eyes nightly., Disp: , Rfl:  .  loratadine (CLARITIN) 10 MG tablet, Take by mouth., Disp: , Rfl:  .  LORazepam (ATIVAN) 0.5 MG tablet, Take 0.5 mg by mouth as needed. , Disp: , Rfl:  .  magnesium oxide (MAG-OX) 400 MG tablet, Take by mouth., Disp: , Rfl:  .  mirabegron ER (MYRBETRIQ) 25 MG TB24 tablet, Take by mouth., Disp: , Rfl:  .  oxyCODONE-acetaminophen (PERCOCET) 10-325 MG tablet, Take by mouth., Disp: , Rfl:  .  oxyCODONE-acetaminophen  (PERCOCET/ROXICET) 5-325 MG tablet, Take by mouth., Disp: , Rfl:  .  pantoprazole (PROTONIX) 40 MG tablet, Take by mouth., Disp: , Rfl:  .  potassium chloride SA (K-DUR,KLOR-CON) 20 MEQ tablet, Take by mouth as needed (only takes when taking the fluid pill or sweating a lot). , Disp: , Rfl:  .  pyridOXINE (B-6) 50 MG tablet, Take by mouth., Disp: , Rfl:  .  rosuvastatin (CRESTOR) 10 MG tablet, Take by mouth., Disp: , Rfl:  .  spironolactone (ALDACTONE) 25 MG tablet, Take by mouth as needed (takes when swelling). , Disp: , Rfl:  .  tamsulosin (FLOMAX) 0.4 MG CAPS capsule, Take by mouth., Disp: , Rfl:  .  torsemide (DEMADEX) 20 MG tablet, Take by mouth as needed (takes when swelling occurs). , Disp: , Rfl:  .  vitamin A 16109 UNIT capsule, Take by mouth., Disp: , Rfl:  .  vitamin B-12 (CYANOCOBALAMIN) 1000 MCG tablet, Take by mouth., Disp: , Rfl:  .  vitamin C (ASCORBIC ACID) 500 MG tablet, Take by mouth., Disp: , Rfl:  .  vitamin E 400 UNIT capsule, Take by mouth., Disp: , Rfl:

## 2018-10-24 NOTE — Patient Instructions (Signed)
Tracheobronchomalacia: This is the medical term that means that your airways collapse when you breathe Is important for you to use CPAP every night It is important for you to let us know if you get a cough or cold with mucus production because you should be treated with antibiotics sooner rather than later  Interstitial lung disease: This is the medical term for the very mild scarring that was seen in the base of your lungs.  Again, this is mild, it is likely related to your underlying polymyalgia rheumatica, but it is not severe enough to be causing your shortness of breath We will monitor this with a repeat lung function test in April 2020 Ask your primary care physician if you can have a flu shot  Shortness of breath/deconditioning: I strongly recommend that she participate in a regular, structured exercise routine. I will refer you back to pulmonary rehab.  We will see you back in April 2020 or sooner if needed  > 50% of this 25 minute visit spent face to face

## 2018-10-27 ENCOUNTER — Telehealth: Payer: Self-pay | Admitting: Pulmonary Disease

## 2018-10-27 NOTE — Telephone Encounter (Signed)
Called and left message for Selena Batten, at [place], to call back and  let us know fax number to send OV notes to.

## 2018-10-31 NOTE — Telephone Encounter (Signed)
Attempted to call Seth Sims at [facility] to obtain the fax number that the OV notes needed to be sent to but unable to reach her. Left message for Seth Sims to return call.

## 2018-11-02 NOTE — Telephone Encounter (Signed)
Attempted to call Selena BattenKim with [facility] but unable to reach her. Left message for Selena BattenKim to return call.

## 2018-11-03 NOTE — Telephone Encounter (Signed)
attempted to call Seth Sims with [facility] but unable to reach her. Left message for Seth Sims to return call. Due to multiple attempts trying to reach [name], per triage protocol, message will be closed.

## 2018-11-08 ENCOUNTER — Telehealth: Payer: Self-pay | Admitting: Pulmonary Disease

## 2018-11-08 NOTE — Telephone Encounter (Signed)
LMTCB with Nicholas LoseKim [facility] Nurse Navigator.

## 2018-11-09 NOTE — Telephone Encounter (Signed)
Attempted to contact Selena BattenKim. I did not receive an answer. I have left a message for her to return our call.

## 2018-11-13 NOTE — Telephone Encounter (Signed)
Attempted to contact Seth Sims. I did not receive an answer. I have left a message for her to return our call.  

## 2018-11-14 NOTE — Telephone Encounter (Signed)
We have attempted to contact Seth Sims several times with no success or call back. Per triage protocol, message will be closed.

## 2018-12-05 ENCOUNTER — Ambulatory Visit: Payer: No Typology Code available for payment source | Admitting: Pulmonary Disease

## 2019-01-25 IMAGING — CT CT CHEST HIGH RESOLUTION W/O CM
2 of 5 series · 14 of 36 positions shown, 17 images · non-contrast
Comparison: Chest CT 03/10/2018.

CLINICAL DATA: 77-year-old male with shortness of breath for the
past 2 years. Former smoker.

EXAM:
CT CHEST WITHOUT CONTRAST
TECHNIQUE: Multidetector CT imaging of the chest was performed following the
standard protocol without intravenous contrast. High resolution
imaging of the lungs, as well as inspiratory and expiratory imaging,
was performed.

[Series 2: thorax · axial · 0.85mm/px · z∈[-288,-30]mm · 11 of 149 slices shown, 14 images]
[im 13/149  mediastinal]
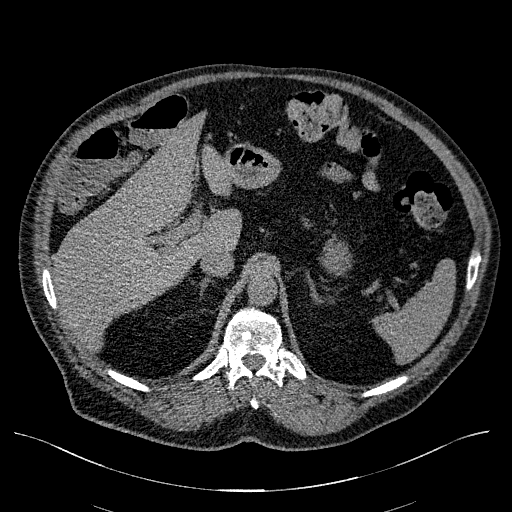
[im 13/149  lung]
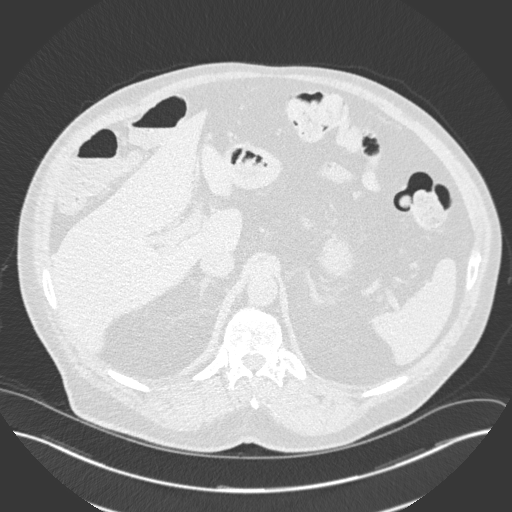
[im 26/149  lung]
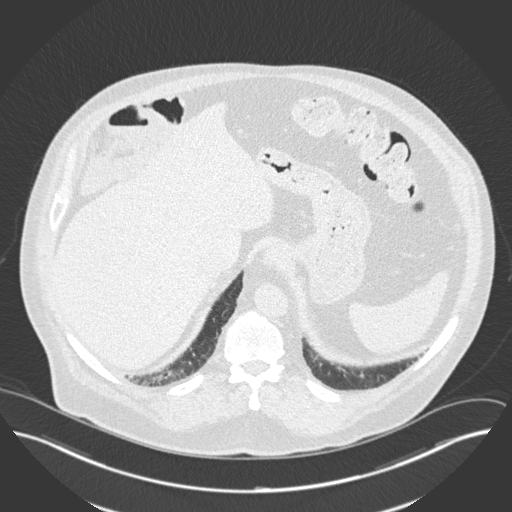
[im 39/149  lung]
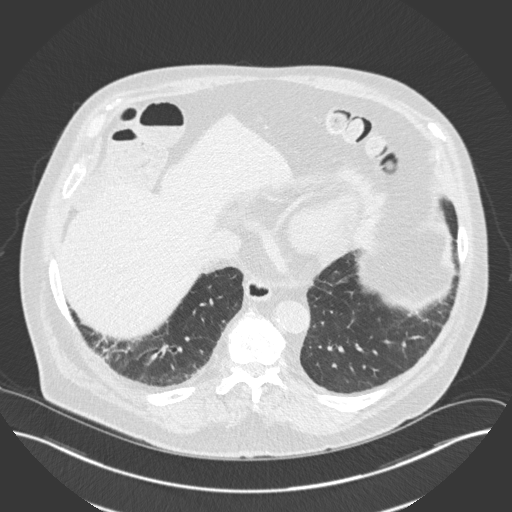
[im 52/149  lung]
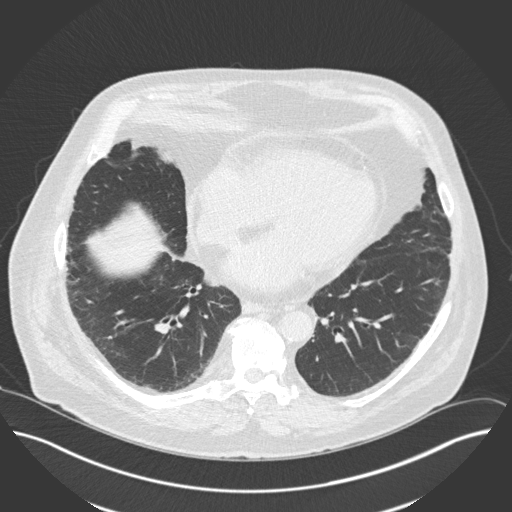
[im 65/149  mediastinal]
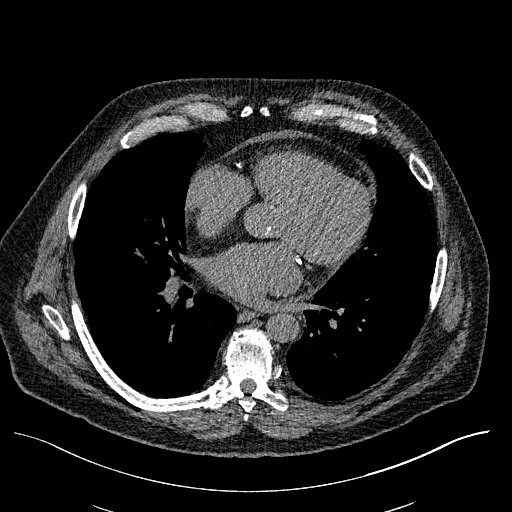
[im 65/149  lung]
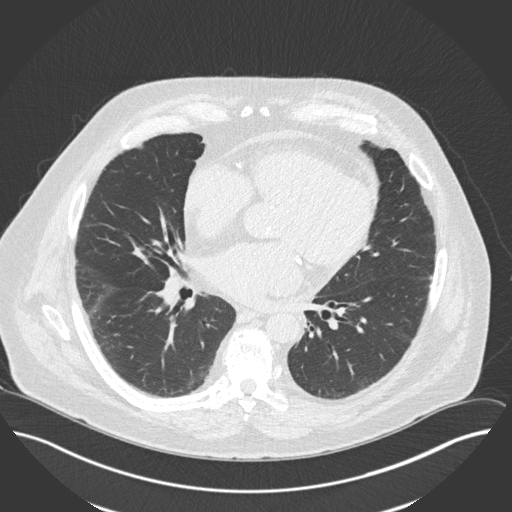
[im 78/149  lung]
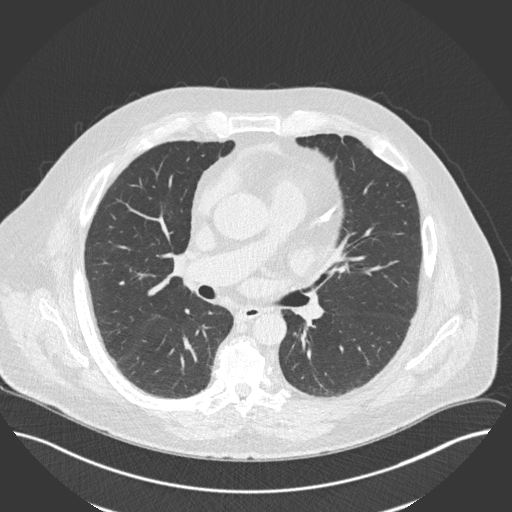
[im 91/149  lung]
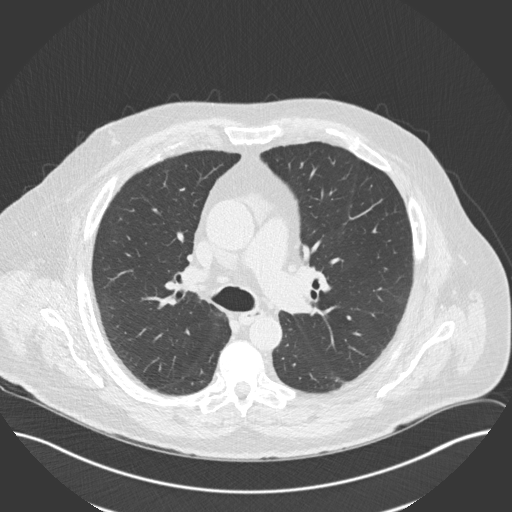
[im 103/149  lung]
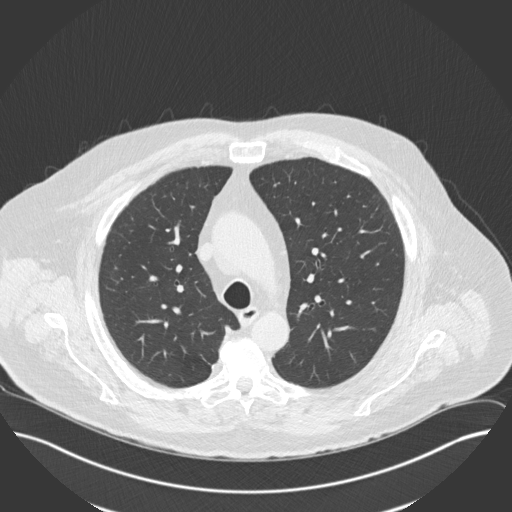
[im 116/149  mediastinal]
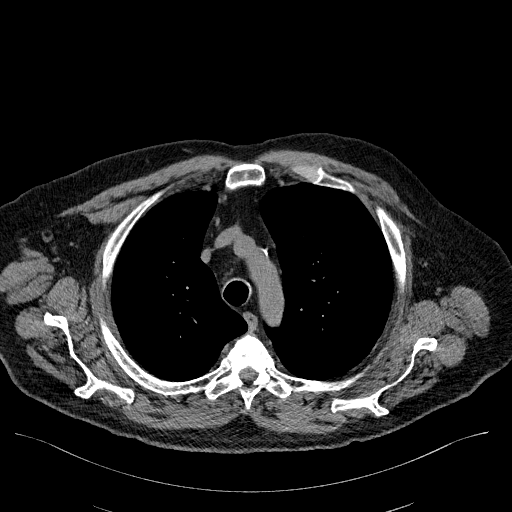
[im 116/149  lung]
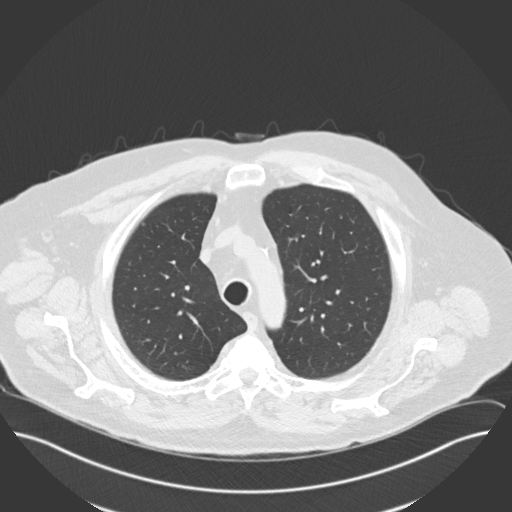
[im 129/149  lung]
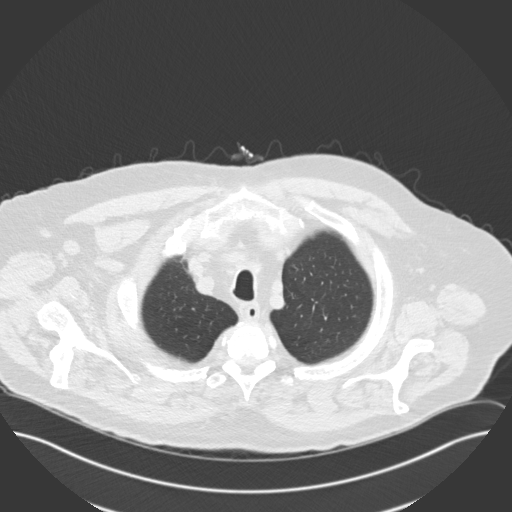
[im 142/149  lung]
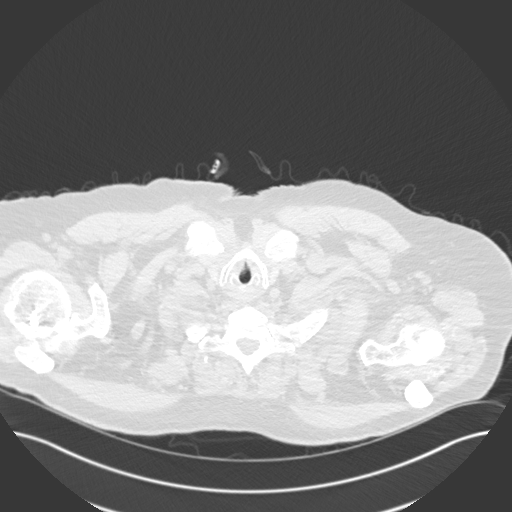

[Series 8: coronal · coronal · 0.66mm/px · 3 of 163 slices shown]
[im 33/163  lung]
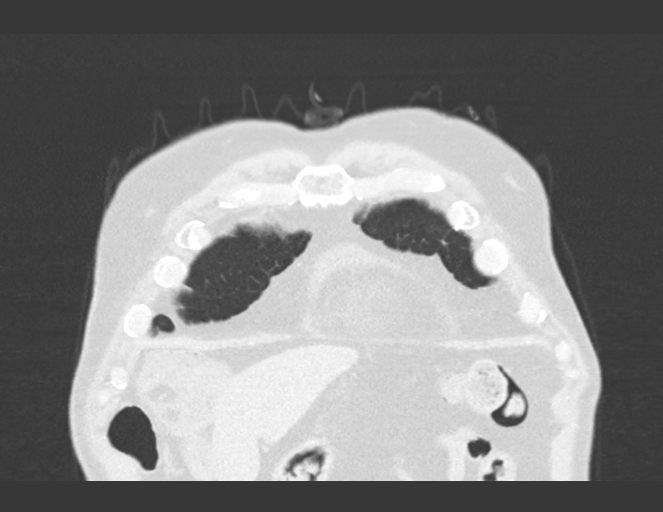
[im 65/163  lung]
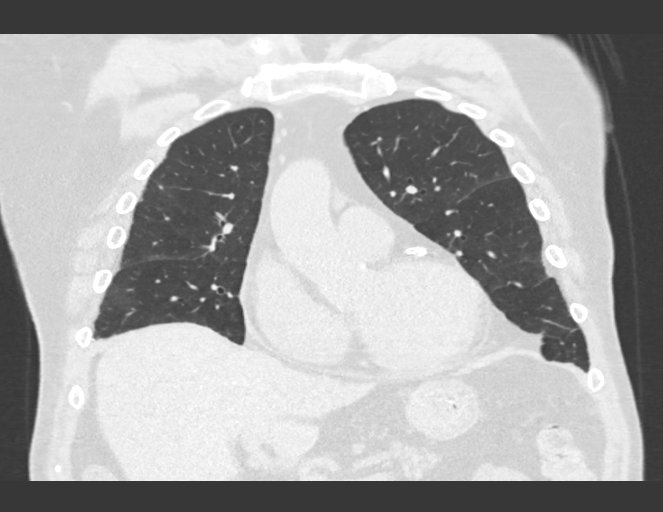
[im 98/163  lung]
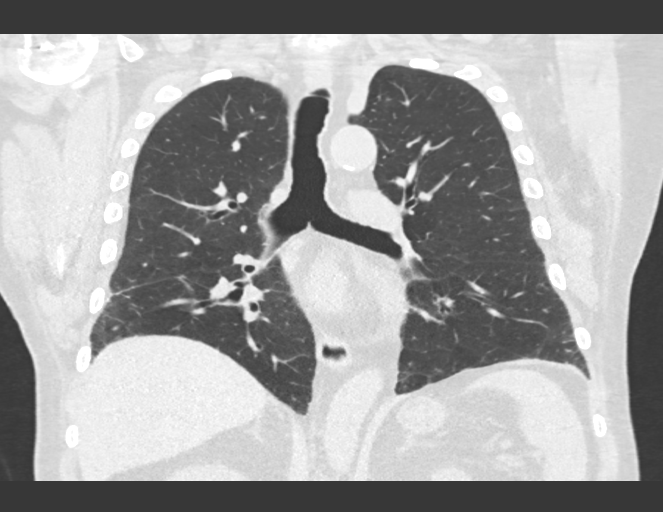

[14 of 36 positions shown; findings below may reference images not displayed]

FINDINGS: Cardiovascular: Heart size is normal. Trace amount of pericardial
fluid and/or thickening, unlikely to be of hemodynamic significance
at this time. There is aortic atherosclerosis, as well as
atherosclerosis of the great vessels of the mediastinum and the
coronary arteries, including calcified atherosclerotic plaque in the
left main, left anterior descending, left circumflex and right
coronary arteries. Calcifications of the aortic valve.

Mediastinum/Nodes: No pathologically enlarged mediastinal or hilar
lymph nodes. Please note that accurate exclusion of hilar adenopathy
is limited on noncontrast CT scans. Esophagus is unremarkable in
appearance. No axillary lymphadenopathy.

Lungs/Pleura: 4 mm right upper lobe nodule (axial image 40 of series
5). Small calcified granuloma in the posterior aspect of the right
upper lobe also noted. No larger more suspicious appearing pulmonary
nodules or masses are noted. No acute consolidative airspace
disease. No pleural effusions. High-resolution images demonstrate
scattered areas of mild peripheral predominant ground-glass
attenuation and septal thickening most evident throughout the lung
bases where there is also some associated peripheral
bronchiolectasis. No traction bronchiectasis or honeycombing noted
at this time. Inspiratory and expiratory imaging demonstrates
moderate air trapping indicative of small airways disease. There is
also profound collapse of the trachea and mainstem bronchi during
expiration, indicative of severe tracheobronchomalacia.

Upper Abdomen: Aortic atherosclerosis.

Musculoskeletal: There are no aggressive appearing lytic or blastic
lesions noted in the visualized portions of the skeleton.
IMPRESSION: 1. Subtle changes of mild fibrosis in the lung bases bilaterally,
which appear relatively stable compared to the prior chest CT from
03/10/2018. At this time, findings are nonspecific and classified as
ATS indeterminate for usual interstitial pneumonia (UIP). Primary
differential considerations are early usual interstitial pneumonia
versus nonspecific interstitial pneumonia, and given the presence of
air trapping, nonspecific interstitial pneumonia (NSIP) is more
favored at this time. Repeat high-resolution chest CT is recommended
in 12 months to assess for temporal changes in the appearance of the
lung parenchyma.
2. Severe tracheobronchomalacia.
3. Aortic atherosclerosis, in addition to left main and 3 vessel
coronary artery disease. Assessment for potential risk factor
modification, dietary therapy or pharmacologic therapy may be
warranted, if clinically indicated.
4. There are calcifications of the aortic valve. Echocardiographic
correlation for evaluation of potential valvular dysfunction may be
warranted if clinically indicated.

Aortic Atherosclerosis ([id number]).

## 2020-10-23 ENCOUNTER — Ambulatory Visit: Payer: No Typology Code available for payment source | Admitting: Internal Medicine

## 2020-11-17 ENCOUNTER — Ambulatory Visit: Payer: No Typology Code available for payment source | Admitting: Pulmonary Disease

## 2023-07-05 DIAGNOSIS — I4891 Unspecified atrial fibrillation: Secondary | ICD-10-CM | POA: Diagnosis not present

## 2023-07-26 DIAGNOSIS — J8410 Pulmonary fibrosis, unspecified: Secondary | ICD-10-CM | POA: Diagnosis not present

## 2023-07-26 DIAGNOSIS — Z950 Presence of cardiac pacemaker: Secondary | ICD-10-CM | POA: Diagnosis not present

## 2023-07-26 DIAGNOSIS — S40021A Contusion of right upper arm, initial encounter: Secondary | ICD-10-CM | POA: Diagnosis not present

## 2023-07-26 DIAGNOSIS — S8991XA Unspecified injury of right lower leg, initial encounter: Secondary | ICD-10-CM | POA: Diagnosis not present

## 2023-07-26 DIAGNOSIS — S20211A Contusion of right front wall of thorax, initial encounter: Secondary | ICD-10-CM | POA: Diagnosis not present

## 2023-07-26 DIAGNOSIS — S0990XA Unspecified injury of head, initial encounter: Secondary | ICD-10-CM | POA: Diagnosis not present

## 2023-07-26 DIAGNOSIS — M50322 Other cervical disc degeneration at C5-C6 level: Secondary | ICD-10-CM | POA: Diagnosis not present

## 2023-07-26 DIAGNOSIS — Z87891 Personal history of nicotine dependence: Secondary | ICD-10-CM | POA: Diagnosis not present

## 2023-07-26 DIAGNOSIS — G8929 Other chronic pain: Secondary | ICD-10-CM | POA: Diagnosis not present

## 2023-07-26 DIAGNOSIS — S2241XA Multiple fractures of ribs, right side, initial encounter for closed fracture: Secondary | ICD-10-CM | POA: Diagnosis not present

## 2023-07-26 DIAGNOSIS — S299XXA Unspecified injury of thorax, initial encounter: Secondary | ICD-10-CM | POA: Diagnosis not present

## 2023-07-26 DIAGNOSIS — Z7901 Long term (current) use of anticoagulants: Secondary | ICD-10-CM | POA: Diagnosis not present

## 2023-07-26 DIAGNOSIS — J479 Bronchiectasis, uncomplicated: Secondary | ICD-10-CM | POA: Diagnosis not present

## 2023-07-26 DIAGNOSIS — Z043 Encounter for examination and observation following other accident: Secondary | ICD-10-CM | POA: Diagnosis not present

## 2023-07-26 DIAGNOSIS — S8011XA Contusion of right lower leg, initial encounter: Secondary | ICD-10-CM | POA: Diagnosis not present

## 2023-07-26 DIAGNOSIS — M5135 Other intervertebral disc degeneration, thoracolumbar region: Secondary | ICD-10-CM | POA: Diagnosis not present

## 2023-07-26 DIAGNOSIS — I2510 Atherosclerotic heart disease of native coronary artery without angina pectoris: Secondary | ICD-10-CM | POA: Diagnosis not present

## 2023-07-26 DIAGNOSIS — Z79891 Long term (current) use of opiate analgesic: Secondary | ICD-10-CM | POA: Diagnosis not present

## 2023-07-26 DIAGNOSIS — S4991XA Unspecified injury of right shoulder and upper arm, initial encounter: Secondary | ICD-10-CM | POA: Diagnosis not present

## 2023-07-26 DIAGNOSIS — M25511 Pain in right shoulder: Secondary | ICD-10-CM | POA: Diagnosis not present

## 2023-07-26 DIAGNOSIS — M549 Dorsalgia, unspecified: Secondary | ICD-10-CM | POA: Diagnosis not present

## 2023-07-26 DIAGNOSIS — I517 Cardiomegaly: Secondary | ICD-10-CM | POA: Diagnosis not present

## 2023-08-01 DIAGNOSIS — J90 Pleural effusion, not elsewhere classified: Secondary | ICD-10-CM | POA: Diagnosis not present

## 2023-08-01 DIAGNOSIS — G4733 Obstructive sleep apnea (adult) (pediatric): Secondary | ICD-10-CM | POA: Diagnosis not present

## 2023-08-01 DIAGNOSIS — I10 Essential (primary) hypertension: Secondary | ICD-10-CM | POA: Diagnosis not present

## 2023-08-01 DIAGNOSIS — J9621 Acute and chronic respiratory failure with hypoxia: Secondary | ICD-10-CM | POA: Diagnosis not present

## 2023-08-01 DIAGNOSIS — Z09 Encounter for follow-up examination after completed treatment for conditions other than malignant neoplasm: Secondary | ICD-10-CM | POA: Diagnosis not present

## 2023-08-01 DIAGNOSIS — Z0000 Encounter for general adult medical examination without abnormal findings: Secondary | ICD-10-CM | POA: Diagnosis not present

## 2023-08-01 DIAGNOSIS — K219 Gastro-esophageal reflux disease without esophagitis: Secondary | ICD-10-CM | POA: Diagnosis not present

## 2023-08-01 DIAGNOSIS — E782 Mixed hyperlipidemia: Secondary | ICD-10-CM | POA: Diagnosis not present

## 2023-08-01 DIAGNOSIS — I517 Cardiomegaly: Secondary | ICD-10-CM | POA: Diagnosis not present

## 2023-08-01 DIAGNOSIS — Z7901 Long term (current) use of anticoagulants: Secondary | ICD-10-CM | POA: Diagnosis not present

## 2023-08-01 DIAGNOSIS — M353 Polymyalgia rheumatica: Secondary | ICD-10-CM | POA: Diagnosis not present

## 2023-08-01 DIAGNOSIS — J9811 Atelectasis: Secondary | ICD-10-CM | POA: Diagnosis not present

## 2023-08-01 DIAGNOSIS — S2241XD Multiple fractures of ribs, right side, subsequent encounter for fracture with routine healing: Secondary | ICD-10-CM | POA: Diagnosis not present

## 2023-08-01 DIAGNOSIS — I4819 Other persistent atrial fibrillation: Secondary | ICD-10-CM | POA: Diagnosis not present

## 2023-08-09 DIAGNOSIS — I10 Essential (primary) hypertension: Secondary | ICD-10-CM | POA: Diagnosis not present

## 2023-08-09 DIAGNOSIS — I5032 Chronic diastolic (congestive) heart failure: Secondary | ICD-10-CM | POA: Diagnosis not present

## 2023-08-09 DIAGNOSIS — S8011XA Contusion of right lower leg, initial encounter: Secondary | ICD-10-CM | POA: Diagnosis not present

## 2023-08-09 DIAGNOSIS — I4891 Unspecified atrial fibrillation: Secondary | ICD-10-CM | POA: Diagnosis not present

## 2023-08-09 DIAGNOSIS — M7989 Other specified soft tissue disorders: Secondary | ICD-10-CM | POA: Diagnosis not present

## 2023-08-09 DIAGNOSIS — E875 Hyperkalemia: Secondary | ICD-10-CM | POA: Diagnosis not present

## 2023-08-09 DIAGNOSIS — Z87891 Personal history of nicotine dependence: Secondary | ICD-10-CM | POA: Diagnosis not present

## 2023-08-09 DIAGNOSIS — W19XXXD Unspecified fall, subsequent encounter: Secondary | ICD-10-CM | POA: Diagnosis not present

## 2023-08-09 DIAGNOSIS — E785 Hyperlipidemia, unspecified: Secondary | ICD-10-CM | POA: Diagnosis not present

## 2023-08-09 DIAGNOSIS — I2510 Atherosclerotic heart disease of native coronary artery without angina pectoris: Secondary | ICD-10-CM | POA: Diagnosis not present

## 2023-08-09 DIAGNOSIS — R0602 Shortness of breath: Secondary | ICD-10-CM | POA: Diagnosis not present

## 2023-08-12 DIAGNOSIS — I2510 Atherosclerotic heart disease of native coronary artery without angina pectoris: Secondary | ICD-10-CM | POA: Diagnosis not present

## 2023-08-12 DIAGNOSIS — R0609 Other forms of dyspnea: Secondary | ICD-10-CM | POA: Diagnosis not present

## 2023-08-12 DIAGNOSIS — Z955 Presence of coronary angioplasty implant and graft: Secondary | ICD-10-CM | POA: Diagnosis not present

## 2023-08-12 DIAGNOSIS — S41102S Unspecified open wound of left upper arm, sequela: Secondary | ICD-10-CM | POA: Diagnosis not present

## 2023-08-12 DIAGNOSIS — M7989 Other specified soft tissue disorders: Secondary | ICD-10-CM | POA: Diagnosis not present

## 2023-08-12 DIAGNOSIS — I4892 Unspecified atrial flutter: Secondary | ICD-10-CM | POA: Diagnosis not present

## 2023-08-12 DIAGNOSIS — W19XXXD Unspecified fall, subsequent encounter: Secondary | ICD-10-CM | POA: Diagnosis not present

## 2023-08-12 DIAGNOSIS — Z950 Presence of cardiac pacemaker: Secondary | ICD-10-CM | POA: Diagnosis not present

## 2023-08-12 DIAGNOSIS — I5020 Unspecified systolic (congestive) heart failure: Secondary | ICD-10-CM | POA: Diagnosis not present

## 2023-08-12 DIAGNOSIS — I447 Left bundle-branch block, unspecified: Secondary | ICD-10-CM | POA: Diagnosis not present

## 2023-08-12 DIAGNOSIS — I4891 Unspecified atrial fibrillation: Secondary | ICD-10-CM | POA: Diagnosis not present

## 2023-08-12 DIAGNOSIS — I5032 Chronic diastolic (congestive) heart failure: Secondary | ICD-10-CM | POA: Diagnosis not present

## 2023-08-14 DIAGNOSIS — Z5189 Encounter for other specified aftercare: Secondary | ICD-10-CM | POA: Diagnosis not present

## 2023-10-17 DIAGNOSIS — C44519 Basal cell carcinoma of skin of other part of trunk: Secondary | ICD-10-CM | POA: Diagnosis not present

## 2023-10-17 DIAGNOSIS — L578 Other skin changes due to chronic exposure to nonionizing radiation: Secondary | ICD-10-CM | POA: Diagnosis not present

## 2023-10-17 DIAGNOSIS — L570 Actinic keratosis: Secondary | ICD-10-CM | POA: Diagnosis not present

## 2023-10-17 DIAGNOSIS — L821 Other seborrheic keratosis: Secondary | ICD-10-CM | POA: Diagnosis not present

## 2023-10-27 DIAGNOSIS — L570 Actinic keratosis: Secondary | ICD-10-CM | POA: Diagnosis not present

## 2023-10-27 DIAGNOSIS — C44529 Squamous cell carcinoma of skin of other part of trunk: Secondary | ICD-10-CM | POA: Diagnosis not present

## 2023-11-30 DIAGNOSIS — I2510 Atherosclerotic heart disease of native coronary artery without angina pectoris: Secondary | ICD-10-CM | POA: Diagnosis not present

## 2023-12-05 DIAGNOSIS — I447 Left bundle-branch block, unspecified: Secondary | ICD-10-CM | POA: Diagnosis not present

## 2023-12-05 DIAGNOSIS — R0609 Other forms of dyspnea: Secondary | ICD-10-CM | POA: Diagnosis not present

## 2023-12-05 DIAGNOSIS — I5020 Unspecified systolic (congestive) heart failure: Secondary | ICD-10-CM | POA: Diagnosis not present

## 2023-12-05 DIAGNOSIS — I5032 Chronic diastolic (congestive) heart failure: Secondary | ICD-10-CM | POA: Diagnosis not present

## 2023-12-05 DIAGNOSIS — I495 Sick sinus syndrome: Secondary | ICD-10-CM | POA: Diagnosis not present

## 2023-12-05 DIAGNOSIS — I4892 Unspecified atrial flutter: Secondary | ICD-10-CM | POA: Diagnosis not present

## 2023-12-05 DIAGNOSIS — I4891 Unspecified atrial fibrillation: Secondary | ICD-10-CM | POA: Diagnosis not present

## 2023-12-05 DIAGNOSIS — S41102S Unspecified open wound of left upper arm, sequela: Secondary | ICD-10-CM | POA: Diagnosis not present

## 2023-12-05 DIAGNOSIS — Z955 Presence of coronary angioplasty implant and graft: Secondary | ICD-10-CM | POA: Diagnosis not present

## 2023-12-05 DIAGNOSIS — I2510 Atherosclerotic heart disease of native coronary artery without angina pectoris: Secondary | ICD-10-CM | POA: Diagnosis not present

## 2023-12-05 DIAGNOSIS — Z950 Presence of cardiac pacemaker: Secondary | ICD-10-CM | POA: Diagnosis not present

## 2024-01-05 DIAGNOSIS — I2510 Atherosclerotic heart disease of native coronary artery without angina pectoris: Secondary | ICD-10-CM | POA: Diagnosis not present

## 2024-01-05 DIAGNOSIS — I5020 Unspecified systolic (congestive) heart failure: Secondary | ICD-10-CM | POA: Diagnosis not present

## 2024-01-05 DIAGNOSIS — I4891 Unspecified atrial fibrillation: Secondary | ICD-10-CM | POA: Diagnosis not present

## 2024-01-05 DIAGNOSIS — Z79899 Other long term (current) drug therapy: Secondary | ICD-10-CM | POA: Diagnosis not present

## 2024-01-05 DIAGNOSIS — I4892 Unspecified atrial flutter: Secondary | ICD-10-CM | POA: Diagnosis not present

## 2024-01-05 DIAGNOSIS — I493 Ventricular premature depolarization: Secondary | ICD-10-CM | POA: Diagnosis not present

## 2024-01-05 DIAGNOSIS — Z950 Presence of cardiac pacemaker: Secondary | ICD-10-CM | POA: Diagnosis not present

## 2024-01-05 DIAGNOSIS — I4729 Other ventricular tachycardia: Secondary | ICD-10-CM | POA: Diagnosis not present

## 2024-01-09 DIAGNOSIS — I4729 Other ventricular tachycardia: Secondary | ICD-10-CM | POA: Diagnosis not present

## 2024-01-12 DIAGNOSIS — I493 Ventricular premature depolarization: Secondary | ICD-10-CM | POA: Diagnosis not present

## 2024-01-12 DIAGNOSIS — I447 Left bundle-branch block, unspecified: Secondary | ICD-10-CM | POA: Diagnosis not present

## 2024-01-12 DIAGNOSIS — I4729 Other ventricular tachycardia: Secondary | ICD-10-CM | POA: Diagnosis not present

## 2024-01-12 DIAGNOSIS — I4892 Unspecified atrial flutter: Secondary | ICD-10-CM | POA: Diagnosis not present

## 2024-01-12 DIAGNOSIS — Z950 Presence of cardiac pacemaker: Secondary | ICD-10-CM | POA: Diagnosis not present

## 2024-01-12 DIAGNOSIS — I4891 Unspecified atrial fibrillation: Secondary | ICD-10-CM | POA: Diagnosis not present

## 2024-01-12 DIAGNOSIS — I5020 Unspecified systolic (congestive) heart failure: Secondary | ICD-10-CM | POA: Diagnosis not present

## 2024-01-12 DIAGNOSIS — Z79899 Other long term (current) drug therapy: Secondary | ICD-10-CM | POA: Diagnosis not present

## 2024-01-12 DIAGNOSIS — I2510 Atherosclerotic heart disease of native coronary artery without angina pectoris: Secondary | ICD-10-CM | POA: Diagnosis not present

## 2024-01-31 DIAGNOSIS — L570 Actinic keratosis: Secondary | ICD-10-CM | POA: Diagnosis not present

## 2024-01-31 DIAGNOSIS — L821 Other seborrheic keratosis: Secondary | ICD-10-CM | POA: Diagnosis not present

## 2024-01-31 DIAGNOSIS — D485 Neoplasm of uncertain behavior of skin: Secondary | ICD-10-CM | POA: Diagnosis not present

## 2024-02-06 DIAGNOSIS — I4819 Other persistent atrial fibrillation: Secondary | ICD-10-CM | POA: Diagnosis not present

## 2024-02-06 DIAGNOSIS — M51379 Other intervertebral disc degeneration, lumbosacral region without mention of lumbar back pain or lower extremity pain: Secondary | ICD-10-CM | POA: Diagnosis not present

## 2024-02-06 DIAGNOSIS — R600 Localized edema: Secondary | ICD-10-CM | POA: Diagnosis not present

## 2024-02-06 DIAGNOSIS — G4700 Insomnia, unspecified: Secondary | ICD-10-CM | POA: Diagnosis not present

## 2024-02-06 DIAGNOSIS — E785 Hyperlipidemia, unspecified: Secondary | ICD-10-CM | POA: Diagnosis not present

## 2024-02-06 DIAGNOSIS — I10 Essential (primary) hypertension: Secondary | ICD-10-CM | POA: Diagnosis not present

## 2024-02-06 DIAGNOSIS — Z13220 Encounter for screening for lipoid disorders: Secondary | ICD-10-CM | POA: Diagnosis not present

## 2024-02-06 DIAGNOSIS — N3281 Overactive bladder: Secondary | ICD-10-CM | POA: Diagnosis not present

## 2024-02-06 DIAGNOSIS — G4733 Obstructive sleep apnea (adult) (pediatric): Secondary | ICD-10-CM | POA: Diagnosis not present

## 2024-02-06 DIAGNOSIS — K219 Gastro-esophageal reflux disease without esophagitis: Secondary | ICD-10-CM | POA: Diagnosis not present

## 2024-02-06 DIAGNOSIS — R262 Difficulty in walking, not elsewhere classified: Secondary | ICD-10-CM | POA: Diagnosis not present

## 2024-02-13 DIAGNOSIS — R600 Localized edema: Secondary | ICD-10-CM | POA: Diagnosis not present

## 2024-02-13 DIAGNOSIS — K219 Gastro-esophageal reflux disease without esophagitis: Secondary | ICD-10-CM | POA: Diagnosis not present

## 2024-02-13 DIAGNOSIS — E785 Hyperlipidemia, unspecified: Secondary | ICD-10-CM | POA: Diagnosis not present

## 2024-02-24 DIAGNOSIS — K219 Gastro-esophageal reflux disease without esophagitis: Secondary | ICD-10-CM | POA: Diagnosis not present

## 2024-02-24 DIAGNOSIS — R1010 Upper abdominal pain, unspecified: Secondary | ICD-10-CM | POA: Diagnosis not present

## 2024-02-24 DIAGNOSIS — I3139 Other pericardial effusion (noninflammatory): Secondary | ICD-10-CM | POA: Diagnosis not present

## 2024-02-24 DIAGNOSIS — N1831 Chronic kidney disease, stage 3a: Secondary | ICD-10-CM | POA: Diagnosis not present

## 2024-02-24 DIAGNOSIS — Z882 Allergy status to sulfonamides status: Secondary | ICD-10-CM | POA: Diagnosis not present

## 2024-02-24 DIAGNOSIS — I4821 Permanent atrial fibrillation: Secondary | ICD-10-CM | POA: Diagnosis not present

## 2024-02-24 DIAGNOSIS — I2510 Atherosclerotic heart disease of native coronary artery without angina pectoris: Secondary | ICD-10-CM | POA: Diagnosis not present

## 2024-02-24 DIAGNOSIS — J8410 Pulmonary fibrosis, unspecified: Secondary | ICD-10-CM | POA: Diagnosis not present

## 2024-02-24 DIAGNOSIS — D649 Anemia, unspecified: Secondary | ICD-10-CM | POA: Diagnosis not present

## 2024-02-24 DIAGNOSIS — I2699 Other pulmonary embolism without acute cor pulmonale: Secondary | ICD-10-CM | POA: Diagnosis not present

## 2024-02-24 DIAGNOSIS — I081 Rheumatic disorders of both mitral and tricuspid valves: Secondary | ICD-10-CM | POA: Diagnosis not present

## 2024-02-24 DIAGNOSIS — E785 Hyperlipidemia, unspecified: Secondary | ICD-10-CM | POA: Diagnosis not present

## 2024-02-24 DIAGNOSIS — R109 Unspecified abdominal pain: Secondary | ICD-10-CM | POA: Diagnosis not present

## 2024-02-24 DIAGNOSIS — R079 Chest pain, unspecified: Secondary | ICD-10-CM | POA: Diagnosis not present

## 2024-02-24 DIAGNOSIS — Z9981 Dependence on supplemental oxygen: Secondary | ICD-10-CM | POA: Diagnosis not present

## 2024-02-24 DIAGNOSIS — J90 Pleural effusion, not elsewhere classified: Secondary | ICD-10-CM | POA: Diagnosis not present

## 2024-02-24 DIAGNOSIS — Z1152 Encounter for screening for COVID-19: Secondary | ICD-10-CM | POA: Diagnosis not present

## 2024-02-24 DIAGNOSIS — K5900 Constipation, unspecified: Secondary | ICD-10-CM | POA: Diagnosis not present

## 2024-02-24 DIAGNOSIS — I129 Hypertensive chronic kidney disease with stage 1 through stage 4 chronic kidney disease, or unspecified chronic kidney disease: Secondary | ICD-10-CM | POA: Diagnosis not present

## 2024-02-24 DIAGNOSIS — G4733 Obstructive sleep apnea (adult) (pediatric): Secondary | ICD-10-CM | POA: Diagnosis not present

## 2024-02-24 DIAGNOSIS — N3289 Other specified disorders of bladder: Secondary | ICD-10-CM | POA: Diagnosis not present

## 2024-02-24 DIAGNOSIS — R5381 Other malaise: Secondary | ICD-10-CM | POA: Diagnosis not present

## 2024-02-24 DIAGNOSIS — J9601 Acute respiratory failure with hypoxia: Secondary | ICD-10-CM | POA: Diagnosis not present

## 2024-02-24 DIAGNOSIS — I495 Sick sinus syndrome: Secondary | ICD-10-CM | POA: Diagnosis not present

## 2024-02-24 DIAGNOSIS — Z87891 Personal history of nicotine dependence: Secondary | ICD-10-CM | POA: Diagnosis not present

## 2024-02-24 DIAGNOSIS — Z950 Presence of cardiac pacemaker: Secondary | ICD-10-CM | POA: Diagnosis not present

## 2024-02-24 DIAGNOSIS — D696 Thrombocytopenia, unspecified: Secondary | ICD-10-CM | POA: Diagnosis not present

## 2024-02-24 DIAGNOSIS — I491 Atrial premature depolarization: Secondary | ICD-10-CM | POA: Diagnosis not present

## 2024-02-24 DIAGNOSIS — I493 Ventricular premature depolarization: Secondary | ICD-10-CM | POA: Diagnosis not present

## 2024-04-18 DIAGNOSIS — I2510 Atherosclerotic heart disease of native coronary artery without angina pectoris: Secondary | ICD-10-CM | POA: Diagnosis not present

## 2024-04-18 DIAGNOSIS — I5020 Unspecified systolic (congestive) heart failure: Secondary | ICD-10-CM | POA: Diagnosis not present

## 2024-04-18 DIAGNOSIS — I2699 Other pulmonary embolism without acute cor pulmonale: Secondary | ICD-10-CM | POA: Diagnosis not present

## 2024-04-18 DIAGNOSIS — Z950 Presence of cardiac pacemaker: Secondary | ICD-10-CM | POA: Diagnosis not present

## 2024-04-18 DIAGNOSIS — I4892 Unspecified atrial flutter: Secondary | ICD-10-CM | POA: Diagnosis not present

## 2024-04-18 DIAGNOSIS — I4729 Other ventricular tachycardia: Secondary | ICD-10-CM | POA: Diagnosis not present

## 2024-04-18 DIAGNOSIS — I4891 Unspecified atrial fibrillation: Secondary | ICD-10-CM | POA: Diagnosis not present

## 2024-04-25 DIAGNOSIS — I2510 Atherosclerotic heart disease of native coronary artery without angina pectoris: Secondary | ICD-10-CM | POA: Diagnosis not present

## 2024-05-08 DIAGNOSIS — R233 Spontaneous ecchymoses: Secondary | ICD-10-CM | POA: Diagnosis not present

## 2024-05-08 DIAGNOSIS — L570 Actinic keratosis: Secondary | ICD-10-CM | POA: Diagnosis not present

## 2024-05-08 DIAGNOSIS — C44622 Squamous cell carcinoma of skin of right upper limb, including shoulder: Secondary | ICD-10-CM | POA: Diagnosis not present

## 2024-05-29 DIAGNOSIS — C44622 Squamous cell carcinoma of skin of right upper limb, including shoulder: Secondary | ICD-10-CM | POA: Diagnosis not present

## 2024-06-25 DIAGNOSIS — L570 Actinic keratosis: Secondary | ICD-10-CM | POA: Diagnosis not present

## 2024-06-25 DIAGNOSIS — L821 Other seborrheic keratosis: Secondary | ICD-10-CM | POA: Diagnosis not present

## 2024-06-29 DIAGNOSIS — M62830 Muscle spasm of back: Secondary | ICD-10-CM | POA: Diagnosis not present

## 2024-06-29 DIAGNOSIS — R197 Diarrhea, unspecified: Secondary | ICD-10-CM | POA: Diagnosis not present

## 2024-06-29 DIAGNOSIS — M545 Low back pain, unspecified: Secondary | ICD-10-CM | POA: Diagnosis not present

## 2024-07-02 DIAGNOSIS — I4891 Unspecified atrial fibrillation: Secondary | ICD-10-CM | POA: Diagnosis not present

## 2024-07-02 DIAGNOSIS — Z950 Presence of cardiac pacemaker: Secondary | ICD-10-CM | POA: Diagnosis not present

## 2024-07-02 DIAGNOSIS — I4892 Unspecified atrial flutter: Secondary | ICD-10-CM | POA: Diagnosis not present

## 2024-07-03 DIAGNOSIS — M533 Sacrococcygeal disorders, not elsewhere classified: Secondary | ICD-10-CM | POA: Diagnosis not present

## 2024-07-03 DIAGNOSIS — M5442 Lumbago with sciatica, left side: Secondary | ICD-10-CM | POA: Diagnosis not present

## 2024-07-03 DIAGNOSIS — K529 Noninfective gastroenteritis and colitis, unspecified: Secondary | ICD-10-CM | POA: Diagnosis not present

## 2024-07-03 DIAGNOSIS — Z87891 Personal history of nicotine dependence: Secondary | ICD-10-CM | POA: Diagnosis not present

## 2024-07-03 DIAGNOSIS — N3289 Other specified disorders of bladder: Secondary | ICD-10-CM | POA: Diagnosis not present

## 2024-07-03 DIAGNOSIS — K5730 Diverticulosis of large intestine without perforation or abscess without bleeding: Secondary | ICD-10-CM | POA: Diagnosis not present

## 2024-07-06 DIAGNOSIS — I493 Ventricular premature depolarization: Secondary | ICD-10-CM | POA: Diagnosis not present

## 2024-07-06 DIAGNOSIS — I2699 Other pulmonary embolism without acute cor pulmonale: Secondary | ICD-10-CM | POA: Diagnosis not present

## 2024-07-06 DIAGNOSIS — R0609 Other forms of dyspnea: Secondary | ICD-10-CM | POA: Diagnosis not present

## 2024-07-06 DIAGNOSIS — I495 Sick sinus syndrome: Secondary | ICD-10-CM | POA: Diagnosis not present

## 2024-07-06 DIAGNOSIS — Z950 Presence of cardiac pacemaker: Secondary | ICD-10-CM | POA: Diagnosis not present

## 2024-07-06 DIAGNOSIS — Z79899 Other long term (current) drug therapy: Secondary | ICD-10-CM | POA: Diagnosis not present

## 2024-07-06 DIAGNOSIS — I2510 Atherosclerotic heart disease of native coronary artery without angina pectoris: Secondary | ICD-10-CM | POA: Diagnosis not present

## 2024-07-06 DIAGNOSIS — I4729 Other ventricular tachycardia: Secondary | ICD-10-CM | POA: Diagnosis not present

## 2024-07-06 DIAGNOSIS — I5032 Chronic diastolic (congestive) heart failure: Secondary | ICD-10-CM | POA: Diagnosis not present

## 2024-07-06 DIAGNOSIS — I5020 Unspecified systolic (congestive) heart failure: Secondary | ICD-10-CM | POA: Diagnosis not present

## 2024-07-07 DIAGNOSIS — Z7901 Long term (current) use of anticoagulants: Secondary | ICD-10-CM | POA: Diagnosis not present

## 2024-07-07 DIAGNOSIS — I10 Essential (primary) hypertension: Secondary | ICD-10-CM | POA: Diagnosis not present

## 2024-07-07 DIAGNOSIS — Z79899 Other long term (current) drug therapy: Secondary | ICD-10-CM | POA: Diagnosis not present

## 2024-07-07 DIAGNOSIS — E785 Hyperlipidemia, unspecified: Secondary | ICD-10-CM | POA: Diagnosis not present

## 2024-07-07 DIAGNOSIS — M51362 Other intervertebral disc degeneration, lumbar region with discogenic back pain and lower extremity pain: Secondary | ICD-10-CM | POA: Diagnosis not present

## 2024-07-07 DIAGNOSIS — R600 Localized edema: Secondary | ICD-10-CM | POA: Diagnosis not present

## 2024-07-07 DIAGNOSIS — Z96653 Presence of artificial knee joint, bilateral: Secondary | ICD-10-CM | POA: Diagnosis not present

## 2024-07-07 DIAGNOSIS — I2510 Atherosclerotic heart disease of native coronary artery without angina pectoris: Secondary | ICD-10-CM | POA: Diagnosis not present

## 2024-07-13 DIAGNOSIS — Z950 Presence of cardiac pacemaker: Secondary | ICD-10-CM | POA: Diagnosis not present

## 2024-07-13 DIAGNOSIS — I493 Ventricular premature depolarization: Secondary | ICD-10-CM | POA: Diagnosis not present

## 2024-07-13 DIAGNOSIS — I5020 Unspecified systolic (congestive) heart failure: Secondary | ICD-10-CM | POA: Diagnosis not present

## 2024-07-13 DIAGNOSIS — I5032 Chronic diastolic (congestive) heart failure: Secondary | ICD-10-CM | POA: Diagnosis not present

## 2024-07-13 DIAGNOSIS — I4729 Other ventricular tachycardia: Secondary | ICD-10-CM | POA: Diagnosis not present

## 2024-07-17 DIAGNOSIS — M533 Sacrococcygeal disorders, not elsewhere classified: Secondary | ICD-10-CM | POA: Diagnosis not present

## 2024-07-17 DIAGNOSIS — M545 Low back pain, unspecified: Secondary | ICD-10-CM | POA: Diagnosis not present

## 2024-07-24 DIAGNOSIS — G894 Chronic pain syndrome: Secondary | ICD-10-CM | POA: Diagnosis not present

## 2024-07-24 DIAGNOSIS — I493 Ventricular premature depolarization: Secondary | ICD-10-CM | POA: Diagnosis not present

## 2024-07-24 DIAGNOSIS — Z950 Presence of cardiac pacemaker: Secondary | ICD-10-CM | POA: Diagnosis not present

## 2024-07-24 DIAGNOSIS — I4729 Other ventricular tachycardia: Secondary | ICD-10-CM | POA: Diagnosis not present

## 2024-07-24 DIAGNOSIS — M353 Polymyalgia rheumatica: Secondary | ICD-10-CM | POA: Diagnosis not present

## 2024-07-24 DIAGNOSIS — I5032 Chronic diastolic (congestive) heart failure: Secondary | ICD-10-CM | POA: Diagnosis not present

## 2024-07-24 DIAGNOSIS — I2510 Atherosclerotic heart disease of native coronary artery without angina pectoris: Secondary | ICD-10-CM | POA: Diagnosis not present

## 2024-07-24 DIAGNOSIS — M25562 Pain in left knee: Secondary | ICD-10-CM | POA: Diagnosis not present

## 2024-07-24 DIAGNOSIS — I5020 Unspecified systolic (congestive) heart failure: Secondary | ICD-10-CM | POA: Diagnosis not present

## 2024-08-02 DIAGNOSIS — M533 Sacrococcygeal disorders, not elsewhere classified: Secondary | ICD-10-CM | POA: Diagnosis not present

## 2024-08-02 DIAGNOSIS — G894 Chronic pain syndrome: Secondary | ICD-10-CM | POA: Diagnosis not present

## 2024-08-07 DIAGNOSIS — G8929 Other chronic pain: Secondary | ICD-10-CM | POA: Diagnosis not present

## 2024-08-07 DIAGNOSIS — E782 Mixed hyperlipidemia: Secondary | ICD-10-CM | POA: Diagnosis not present

## 2024-08-07 DIAGNOSIS — I10 Essential (primary) hypertension: Secondary | ICD-10-CM | POA: Diagnosis not present

## 2024-08-07 DIAGNOSIS — R197 Diarrhea, unspecified: Secondary | ICD-10-CM | POA: Diagnosis not present

## 2024-08-07 DIAGNOSIS — R233 Spontaneous ecchymoses: Secondary | ICD-10-CM | POA: Diagnosis not present

## 2024-09-05 DIAGNOSIS — R233 Spontaneous ecchymoses: Secondary | ICD-10-CM | POA: Diagnosis not present

## 2024-09-05 DIAGNOSIS — L570 Actinic keratosis: Secondary | ICD-10-CM | POA: Diagnosis not present

## 2024-09-18 DIAGNOSIS — Z7901 Long term (current) use of anticoagulants: Secondary | ICD-10-CM | POA: Diagnosis not present

## 2024-09-18 DIAGNOSIS — G894 Chronic pain syndrome: Secondary | ICD-10-CM | POA: Diagnosis not present

## 2024-09-18 DIAGNOSIS — S81812A Laceration without foreign body, left lower leg, initial encounter: Secondary | ICD-10-CM | POA: Diagnosis not present

## 2024-09-18 DIAGNOSIS — Z79899 Other long term (current) drug therapy: Secondary | ICD-10-CM | POA: Diagnosis not present

## 2024-09-18 DIAGNOSIS — M533 Sacrococcygeal disorders, not elsewhere classified: Secondary | ICD-10-CM | POA: Diagnosis not present

## 2024-09-18 DIAGNOSIS — E785 Hyperlipidemia, unspecified: Secondary | ICD-10-CM | POA: Diagnosis not present

## 2024-09-18 DIAGNOSIS — Z87891 Personal history of nicotine dependence: Secondary | ICD-10-CM | POA: Diagnosis not present

## 2024-09-18 DIAGNOSIS — I10 Essential (primary) hypertension: Secondary | ICD-10-CM | POA: Diagnosis not present
